# Patient Record
Sex: Male | Born: 1960 | Race: Black or African American | Hispanic: No | Marital: Married | Smoking: Never smoker
Health system: Southern US, Community
[De-identification: ages and names within clinical notes are randomized; demographics above are authoritative.]

## PROBLEM LIST (undated history)

## (undated) DIAGNOSIS — J449 Chronic obstructive pulmonary disease, unspecified: Secondary | ICD-10-CM

## (undated) DIAGNOSIS — J302 Other seasonal allergic rhinitis: Secondary | ICD-10-CM

## (undated) DIAGNOSIS — U071 COVID-19: Secondary | ICD-10-CM

## (undated) HISTORY — PX: EYE SURGERY: SHX253

---

## 2013-09-24 ENCOUNTER — Encounter (HOSPITAL_COMMUNITY): Payer: Self-pay | Admitting: Emergency Medicine

## 2013-09-24 ENCOUNTER — Ambulatory Visit (HOSPITAL_COMMUNITY)
Admission: RE | Admit: 2013-09-24 | Discharge: 2013-09-24 | Disposition: A | Discharge status: Home / Self Care | Payer: Self-pay | Source: Ambulatory Visit | Attending: Physician Assistant | Admitting: Physician Assistant

## 2013-09-24 ENCOUNTER — Inpatient Hospital Stay (HOSPITAL_COMMUNITY): Admission: RE | Admit: 2013-09-24 | Payer: Self-pay | Source: Ambulatory Visit

## 2013-09-24 ENCOUNTER — Ambulatory Visit (HOSPITAL_COMMUNITY): Payer: Self-pay

## 2013-09-24 ENCOUNTER — Emergency Department (HOSPITAL_COMMUNITY)
Admission: EM | Admit: 2013-09-24 | Discharge: 2013-09-24 | Disposition: A | Discharge status: Home / Self Care | Payer: Self-pay | Attending: Emergency Medicine | Admitting: Emergency Medicine

## 2013-09-24 ENCOUNTER — Other Ambulatory Visit (HOSPITAL_COMMUNITY): Payer: Self-pay | Admitting: Physician Assistant

## 2013-09-24 ENCOUNTER — Other Ambulatory Visit (HOSPITAL_COMMUNITY): Payer: Self-pay

## 2013-09-24 DIAGNOSIS — G44309 Post-traumatic headache, unspecified, not intractable: Secondary | ICD-10-CM

## 2013-09-24 DIAGNOSIS — Y9389 Activity, other specified: Secondary | ICD-10-CM | POA: Insufficient documentation

## 2013-09-24 DIAGNOSIS — M545 Low back pain, unspecified: Secondary | ICD-10-CM

## 2013-09-24 DIAGNOSIS — M47812 Spondylosis without myelopathy or radiculopathy, cervical region: Secondary | ICD-10-CM | POA: Insufficient documentation

## 2013-09-24 DIAGNOSIS — R209 Unspecified disturbances of skin sensation: Secondary | ICD-10-CM | POA: Insufficient documentation

## 2013-09-24 DIAGNOSIS — Y92410 Unspecified street and highway as the place of occurrence of the external cause: Secondary | ICD-10-CM | POA: Insufficient documentation

## 2013-09-24 DIAGNOSIS — S065X9A Traumatic subdural hemorrhage with loss of consciousness of unspecified duration, initial encounter: Secondary | ICD-10-CM

## 2013-09-24 DIAGNOSIS — R51 Headache: Secondary | ICD-10-CM | POA: Insufficient documentation

## 2013-09-24 DIAGNOSIS — R5381 Other malaise: Secondary | ICD-10-CM | POA: Insufficient documentation

## 2013-09-24 DIAGNOSIS — S065X0A Traumatic subdural hemorrhage without loss of consciousness, initial encounter: Secondary | ICD-10-CM | POA: Insufficient documentation

## 2013-09-24 DIAGNOSIS — M542 Cervicalgia: Secondary | ICD-10-CM | POA: Insufficient documentation

## 2013-09-24 MED ORDER — LEVETIRACETAM 500 MG PO TABS: 500.0000 mg | ORAL_TABLET | Freq: Two times a day (BID) | ORAL | Status: DC

## 2013-09-24 NOTE — ED Notes (Signed)
Neurologist at the bedside 
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
## 2013-09-25 ENCOUNTER — Ambulatory Visit (HOSPITAL_COMMUNITY): Admission: RE | Admit: 2013-09-25 | Payer: Self-pay | Source: Ambulatory Visit

## 2013-09-25 ENCOUNTER — Ambulatory Visit (HOSPITAL_COMMUNITY): Payer: Self-pay

## 2017-08-24 ENCOUNTER — Emergency Department (HOSPITAL_COMMUNITY): Payer: BLUE CROSS/BLUE SHIELD

## 2017-08-24 ENCOUNTER — Other Ambulatory Visit: Payer: Self-pay

## 2017-08-24 ENCOUNTER — Emergency Department (HOSPITAL_COMMUNITY)
Admission: EM | Admit: 2017-08-24 | Discharge: 2017-08-24 | Disposition: A | Discharge status: Home / Self Care | Payer: BLUE CROSS/BLUE SHIELD | Attending: Emergency Medicine | Admitting: Emergency Medicine

## 2017-08-24 ENCOUNTER — Encounter (HOSPITAL_COMMUNITY): Payer: Self-pay | Admitting: *Deleted

## 2017-08-24 DIAGNOSIS — R202 Paresthesia of skin: Secondary | ICD-10-CM | POA: Diagnosis not present

## 2017-08-24 LAB — COMPREHENSIVE METABOLIC PANEL
ALT: 14 U/L — ABNORMAL LOW (ref 17–63)
AST: 14 U/L — ABNORMAL LOW (ref 15–41)
Albumin: 4.5 g/dL (ref 3.5–5.0)
Alkaline Phosphatase: 74 U/L (ref 38–126)
Anion gap: 6 (ref 5–15)
BUN: 13 mg/dL (ref 6–20)
CO2: 29 mmol/L (ref 22–32)
Calcium: 9.7 mg/dL (ref 8.9–10.3)
Chloride: 103 mmol/L (ref 101–111)
Creatinine, Ser: 1.18 mg/dL (ref 0.61–1.24)
GFR calc Af Amer: >60 mL/min (ref >60)
GFR calc non Af Amer: >60 mL/min (ref >60)
Glucose, Bld: 105 mg/dL — ABNORMAL HIGH (ref 65–99)
Potassium: 3.9 mmol/L (ref 3.5–5.1)
Sodium: 138 mmol/L (ref 135–145)
Total Bilirubin: 0.6 mg/dL (ref 0.3–1.2)
Total Protein: 8.5 g/dL — ABNORMAL HIGH (ref 6.5–8.1)

## 2017-08-24 LAB — URINALYSIS, ROUTINE W REFLEX MICROSCOPIC
Bacteria, UA: NONE SEEN
Bilirubin Urine: NEGATIVE
Glucose, UA: NEGATIVE mg/dL
Ketones, ur: NEGATIVE mg/dL
Leukocytes, UA: NEGATIVE
Nitrite: NEGATIVE
Protein, ur: NEGATIVE mg/dL
Specific Gravity, Urine: 1.012 (ref 1.005–1.030)
Squamous Epithelial / LPF: NONE SEEN
pH: 7 (ref 5.0–8.0)

## 2017-08-24 LAB — DIFFERENTIAL
Basophils Absolute: 0 10*3/uL (ref 0.0–0.1)
Basophils Relative: 0 %
Eosinophils Absolute: 0.1 10*3/uL (ref 0.0–0.7)
Eosinophils Relative: 2 %
Lymphocytes Relative: 48 %
Lymphs Abs: 2.5 10*3/uL (ref 0.7–4.0)
Monocytes Absolute: 0.4 10*3/uL (ref 0.1–1.0)
Monocytes Relative: 8 %
Neutro Abs: 2.2 10*3/uL (ref 1.7–7.7)
Neutrophils Relative %: 42 %

## 2017-08-24 LAB — CBC
HCT: 43 % (ref 39.0–52.0)
Hemoglobin: 13.4 g/dL (ref 13.0–17.0)
MCH: 22.9 pg — ABNORMAL LOW (ref 26.0–34.0)
MCHC: 31.2 g/dL (ref 30.0–36.0)
MCV: 73.6 fL — ABNORMAL LOW (ref 78.0–100.0)
Platelets: 245 10*3/uL (ref 150–400)
RBC: 5.84 MIL/uL — ABNORMAL HIGH (ref 4.22–5.81)
RDW: 15.4 % (ref 11.5–15.5)
WBC: 5.2 10*3/uL (ref 4.0–10.5)

## 2017-08-24 LAB — ETHANOL: Alcohol, Ethyl (B): <10 mg/dL (ref <10)

## 2017-08-24 LAB — RAPID URINE DRUG SCREEN, HOSP PERFORMED
Amphetamines: NOT DETECTED
Barbiturates: NOT DETECTED
Benzodiazepines: NOT DETECTED
Cocaine: NOT DETECTED
Opiates: NOT DETECTED
Tetrahydrocannabinol: NOT DETECTED

## 2017-08-24 LAB — I-STAT TROPONIN, ED: Troponin i, poc: 0.01 ng/mL (ref 0.00–0.08)

## 2017-08-24 LAB — APTT: aPTT: 28 seconds (ref 24–36)

## 2017-08-24 LAB — PROTIME-INR
INR: 1.05
Prothrombin Time: 13.6 seconds (ref 11.4–15.2)

## 2017-08-24 NOTE — ED Notes (Signed)
Returned from CT.

## 2017-08-24 NOTE — ED Notes (Signed)
Pt states he does not need to urinate at this time, aware of DO  

## 2017-08-24 NOTE — Discharge Instructions (Signed)
Follow-up with your primary care doctor, return to the emergency room for worsening or recurrent symptoms °

## 2017-08-24 NOTE — ED Triage Notes (Addendum)
Pt c/o numbness to right hand and right leg that started 3-4 hours ago with a headache that lasted 5-6 minutes, upon arrival to er pt states that the numbness is gone, Dr Tomi Bamberger at bedside,

## 2017-08-24 NOTE — ED Provider Notes (Signed)
[facility] EMERGENCY DEPARTMENT Provider Note   CSN: [id number] Arrival date & time: 08/24/17  1836     History   Chief Complaint Chief Complaint  Patient presents with  . Numbness    HPI Dennis Lane is a 56 y.o. male.  HPI Patient presents to the emergency room for evaluation of numbness and tingling in his right hand as well as right leg.  His symptoms started about 2 or 3 hours ago and lasted for maybe 5 or 6 minutes.  The symptoms all resolved.  Patient does have a little bit of a mild headache but otherwise no other complaints.  He denies any trouble with his speech.  No trouble with his balance or gait.  No weakness.  No blurred vision.  History reviewed. No pertinent past medical history.  There are no active problems to display for this patient.   Past Surgical History:  Procedure Laterality Date  . EYE SURGERY         Home Medications    Prior to Admission medications   Medication Sig Start Date End Date Taking? Authorizing Provider  fluticasone (FLONASE) 50 MCG/ACT nasal spray Place 2 sprays into both nostrils daily.  07/18/17  Yes [provider]  PROAIR HFA 108 (90 Base) MCG/ACT inhaler Inhale 1-2 puffs into the lungs every 6 (six) hours as needed for wheezing or shortness of breath.  07/26/17  Yes [provider]    Family History No family history on file.  Social History Social History   Tobacco Use  . Smoking status: Never Smoker  . Smokeless tobacco: Never Used  Substance Use Topics  . Alcohol use: No  . Drug use: No     Allergies   Patient has no known allergies.   Review of Systems Review of Systems  All other systems reviewed and are negative.    Physical Exam Updated Vital Signs BP (!) 135/91   Pulse (!) 52   Temp 99.2 F (37.3 C)   Resp 19   Ht 1.727 m (5\' 8" )   Wt 75.3 kg (166 lb)   SpO2 99%   BMI 25.24 kg/m   Physical Exam  Constitutional: He is oriented to person, place, and time. He  appears well-developed and well-nourished. No distress.  HENT:  Head: Normocephalic and atraumatic.  Right Ear: External ear normal.  Left Ear: External ear normal.  Mouth/Throat: Oropharynx is clear and moist.  Eyes: Conjunctivae are normal. Right eye exhibits no discharge. Left eye exhibits no discharge. No scleral icterus.  Neck: Neck supple. No tracheal deviation present.  Cardiovascular: Normal rate, regular rhythm and intact distal pulses.  Pulmonary/Chest: Effort normal and breath sounds normal. No stridor. No respiratory distress. He has no wheezes. He has no rales.  Abdominal: Soft. Bowel sounds are normal. He exhibits no distension. There is no tenderness. There is no rebound and no guarding.  Musculoskeletal: He exhibits no edema or tenderness.  Neurological: He is alert and oriented to person, place, and time. He has normal strength. No cranial nerve deficit (No facial droop, extraocular movements intact, tongue midline ) or sensory deficit. He exhibits normal muscle tone. He displays no seizure activity. Coordination normal.  No pronator drift bilateral upper extrem, able to hold both legs off bed for 5 seconds, sensation intact in all extremities, no visual field cuts, no left or right sided neglect, normal finger-nose exam bilaterally, no nystagmus noted   Skin: Skin is warm and dry. No rash noted.  Psychiatric: He  has a normal mood and affect.  Nursing note and vitals reviewed.    ED Treatments / Results  Labs (all labs ordered are listed, but only abnormal results are displayed) Labs Reviewed  CBC - Abnormal; Notable for the following components:      Result Value   RBC 5.84 (*)    MCV 73.6 (*)    MCH 22.9 (*)    All other components within normal limits  COMPREHENSIVE METABOLIC PANEL - Abnormal; Notable for the following components:   Glucose, Bld 105 (*)    Total Protein 8.5 (*)    AST 14 (*)    ALT 14 (*)    All other components within normal limits    URINALYSIS, ROUTINE W REFLEX MICROSCOPIC - Abnormal; Notable for the following components:   Color, Urine STRAW (*)    Hgb urine dipstick SMALL (*)    All other components within normal limits  ETHANOL  PROTIME-INR  APTT  DIFFERENTIAL  RAPID URINE DRUG SCREEN, HOSP PERFORMED  I-STAT TROPONIN, ED    EKG  EKG Interpretation  Date/Time:  Thursday August 24 2017 20:55:43 EST Ventricular Rate:  58 PR Interval:    QRS Duration: 89 QT Interval:  416 QTC Calculation: 409 R Axis:   76 Text Interpretation:  Sinus rhythm No old tracing to compare Confirmed by Dorie Rank [phone]) on 08/24/2017 8:57:57 PM       Radiology Ct Head Wo Contrast  Result Date: 08/24/2017 CLINICAL DATA:  Numbness and tingling in the right hand and leg that started 3-4 hours ago. Numbness has resolved. EXAM: CT HEAD WITHOUT CONTRAST TECHNIQUE: Contiguous axial images were obtained from the base of the skull through the vertex without intravenous contrast. COMPARISON:  09/24/2013 FINDINGS: Brain: No evidence of infarction, hemorrhage, hydrocephalus, extra-axial collection or mass lesion/mass effect. Vascular: No hyperdense vessel or unexpected calcification. Skull: Normal. Negative for fracture or focal lesion. Sinuses/Orbits: No acute finding. IMPRESSION: Negative head CT. Electronically Signed   By: Monte Fantasia M.D.   On: 08/24/2017 19:09    Procedures Procedures (including critical care time)  Medications Ordered in ED Medications - No data to display   Initial Impression / Assessment and Plan / ED Course  I have reviewed the triage vital signs and the nursing notes.  Pertinent labs & imaging results that were available during my care of the patient were reviewed by me and considered in my medical decision making (see chart for details).   Patient presented to the emergency room for evaluation of a few minutes of numbness.  Patient's neurologic exam is normal.  Overall he is low risk for TIA/stroke.   Possible symptoms may have been an atypical migraine as he did have a headache.  At this time there does not appear to be any evidence of an acute emergency medical condition and the patient appears stable for discharge with appropriate outpatient follow up.   Final Clinical Impressions(s) / ED Diagnoses   Final diagnoses:  Paresthesia    ED Discharge Orders    None       Dorie Rank, MD 08/24/17 2059

## 2020-09-09 ENCOUNTER — Telehealth: Payer: BC Managed Care – PPO

## 2021-02-23 ENCOUNTER — Emergency Department (HOSPITAL_COMMUNITY): Payer: BC Managed Care – PPO

## 2021-02-23 ENCOUNTER — Emergency Department (HOSPITAL_COMMUNITY)
Admission: EM | Admit: 2021-02-23 | Discharge: 2021-02-24 | Disposition: A | Discharge status: Home / Self Care | Payer: BC Managed Care – PPO | Attending: Emergency Medicine | Admitting: Emergency Medicine

## 2021-02-23 ENCOUNTER — Other Ambulatory Visit: Payer: Self-pay

## 2021-02-23 ENCOUNTER — Encounter (HOSPITAL_COMMUNITY): Payer: Self-pay | Admitting: *Deleted

## 2021-02-23 DIAGNOSIS — R200 Anesthesia of skin: Secondary | ICD-10-CM | POA: Diagnosis present

## 2021-02-23 DIAGNOSIS — D1722 Benign lipomatous neoplasm of skin and subcutaneous tissue of left arm: Secondary | ICD-10-CM | POA: Diagnosis not present

## 2021-02-23 DIAGNOSIS — J069 Acute upper respiratory infection, unspecified: Secondary | ICD-10-CM | POA: Diagnosis not present

## 2021-02-23 DIAGNOSIS — R202 Paresthesia of skin: Secondary | ICD-10-CM | POA: Insufficient documentation

## 2021-02-23 LAB — CBC WITH DIFFERENTIAL/PLATELET
Abs Immature Granulocytes: 0.01 10*3/uL (ref 0.00–0.07)
Basophils Absolute: 0 10*3/uL (ref 0.0–0.1)
Basophils Relative: 1 %
Eosinophils Absolute: 0.2 10*3/uL (ref 0.0–0.5)
Eosinophils Relative: 2 %
HCT: 43.1 % (ref 39.0–52.0)
Hemoglobin: 13.2 g/dL (ref 13.0–17.0)
Immature Granulocytes: 0 %
Lymphocytes Relative: 36 %
Lymphs Abs: 2.3 10*3/uL (ref 0.7–4.0)
MCH: 22.8 pg — ABNORMAL LOW (ref 26.0–34.0)
MCHC: 30.6 g/dL (ref 30.0–36.0)
MCV: 74.6 fL — ABNORMAL LOW (ref 80.0–100.0)
Monocytes Absolute: 0.7 10*3/uL (ref 0.1–1.0)
Monocytes Relative: 11 %
Neutro Abs: 3.3 10*3/uL (ref 1.7–7.7)
Neutrophils Relative %: 50 %
Platelets: 280 10*3/uL (ref 150–400)
RBC: 5.78 MIL/uL (ref 4.22–5.81)
RDW: 15.5 % (ref 11.5–15.5)
WBC: 6.5 10*3/uL (ref 4.0–10.5)
nRBC: 0 % (ref 0.0–0.2)

## 2021-02-23 LAB — PROTIME-INR
INR: 1 (ref 0.8–1.2)
Prothrombin Time: 13.1 seconds (ref 11.4–15.2)

## 2021-02-23 LAB — COMPREHENSIVE METABOLIC PANEL
ALT: 15 U/L (ref 0–44)
AST: 22 U/L (ref 15–41)
Albumin: 4.6 g/dL (ref 3.5–5.0)
Alkaline Phosphatase: 64 U/L (ref 38–126)
Anion gap: 5 (ref 5–15)
BUN: 8 mg/dL (ref 8–23)
CO2: 29 mmol/L (ref 22–32)
Calcium: 9.2 mg/dL (ref 8.9–10.3)
Chloride: 103 mmol/L (ref 98–111)
Creatinine, Ser: 1.05 mg/dL (ref 0.61–1.24)
GFR, Estimated: >60 mL/min (ref >60)
Glucose, Bld: 98 mg/dL (ref 70–99)
Potassium: 4.2 mmol/L (ref 3.5–5.1)
Sodium: 137 mmol/L (ref 135–145)
Total Bilirubin: 0.5 mg/dL (ref 0.3–1.2)
Total Protein: 8.6 g/dL — ABNORMAL HIGH (ref 6.5–8.1)

## 2021-02-23 NOTE — ED Provider Notes (Signed)
[facility] EMERGENCY DEPARTMENT Provider Note   CSN: [id number] Arrival date & time: 02/23/21  2201     History Chief Complaint  Patient presents with  . Numbness    Dennis Lane is a 60 y.o. male.  60 year old male presents with complaint of Left-Sided Body Numbness difficulty with his speech, feeling off balance at times.  Initially, patient reports that this started over the past 3 days which prompted his work-up today to include CT head however on further questioning, states that his feet feel numb off and on for the past few months, the changes in his speech that he is reporting are noticed by those around him as mumbling and he relates this to having lost several of his teeth and has been an ongoing problem for several months.  He feels like the numbness in his feet is what causes him to feel off balance at times.  He has had sinus congestion for the past week and feels like he may have a sinus infection.  He denies fevers, chills, shortness of breath.  Patient has been told in the past that he has high cholesterol but he does not take medications for this as he does not go to a primary care doctor regularly.  He quit smoking 28 years ago.  No other complaints or concerns today.        History reviewed. No pertinent past medical history.  There are no problems to display for this patient.   Past Surgical History:  Procedure Laterality Date  . EYE SURGERY         History reviewed. No pertinent family history.  Social History   Tobacco Use  . Smoking status: Never Smoker  . Smokeless tobacco: Never Used  Substance Use Topics  . Alcohol use: No  . Drug use: No    Home Medications Prior to Admission medications   Medication Sig Start Date End Date Taking? Authorizing Provider  fluticasone (FLONASE) 50 MCG/ACT nasal spray Place 2 sprays into both nostrils daily.  07/18/17   [provider]  PROAIR HFA 108 (90 Base) MCG/ACT inhaler Inhale 1-2 puffs into  the lungs every 6 (six) hours as needed for wheezing or shortness of breath.  07/26/17   [provider]    Allergies    Patient has no known allergies.  Review of Systems   Review of Systems  Constitutional: Negative for chills, diaphoresis and fever.  HENT: Positive for congestion and sinus pressure.   Eyes: Negative for visual disturbance.  Respiratory: Positive for cough. Negative for shortness of breath.   Cardiovascular: Negative for chest pain.  Gastrointestinal: Negative for abdominal pain, constipation, diarrhea, nausea and vomiting.  Genitourinary: Negative for difficulty urinating.  Musculoskeletal: Negative for arthralgias and myalgias.  Skin: Negative for rash and wound.  Allergic/Immunologic: Negative for immunocompromised state.  Neurological: Positive for speech difficulty, numbness and headaches. Negative for dizziness, facial asymmetry and weakness.  Psychiatric/Behavioral: Negative for confusion.  All other systems reviewed and are negative.   Physical Exam Updated Vital Signs BP (!) 140/100   Pulse (!) 56   Temp 98.5 F (36.9 C) (Oral)   Resp (!) 21   Ht 5\' 8"  (1.727 m)   Wt 72.6 kg   SpO2 99%   BMI 24.33 kg/m   Physical Exam Vitals and nursing note reviewed.  Constitutional:      General: He is not in acute distress.    Appearance: He is well-developed. He is not diaphoretic.  HENT:  Head: Normocephalic and atraumatic.     Nose: Nose normal.     Mouth/Throat:     Mouth: Mucous membranes are moist.  Eyes:     Conjunctiva/sclera: Conjunctivae normal.  Cardiovascular:     Rate and Rhythm: Normal rate and regular rhythm.     Pulses: Normal pulses.     Heart sounds: Normal heart sounds.  Pulmonary:     Effort: Pulmonary effort is normal.     Breath sounds: Normal breath sounds.  Abdominal:     Palpations: Abdomen is soft.     Tenderness: There is no abdominal tenderness.  Musculoskeletal:     Cervical back: Neck supple.     Right  lower leg: No edema.     Left lower leg: No edema.  Skin:    General: Skin is warm and dry.     Findings: No erythema or rash.  Neurological:     General: No focal deficit present.     Mental Status: He is alert and oriented to person, place, and time.     Cranial Nerves: No cranial nerve deficit.     Sensory: No sensory deficit.     Motor: No weakness.     Coordination: Coordination normal.     Deep Tendon Reflexes: Reflexes normal.  Psychiatric:        Behavior: Behavior normal.     ED Results / Procedures / Treatments   Labs (all labs ordered are listed, but only abnormal results are displayed) Labs Reviewed  CBC WITH DIFFERENTIAL/PLATELET - Abnormal; Notable for the following components:      Result Value   MCV 74.6 (*)    MCH 22.8 (*)    All other components within normal limits  COMPREHENSIVE METABOLIC PANEL - Abnormal; Notable for the following components:   Total Protein 8.6 (*)    All other components within normal limits  RESP PANEL BY RT-PCR (FLU A&B, COVID) ARPGX2  PROTIME-INR  RAPID URINE DRUG SCREEN, HOSP PERFORMED    EKG EKG Interpretation  Date/Time:  Tuesday Feb 23 2021 22:18:39 EDT Ventricular Rate:  64 PR Interval:  178 QRS Duration: 84 QT Interval:  414 QTC Calculation: 427 R Axis:   63 Text Interpretation: Normal sinus rhythm Normal ECG Confirmed by Noemi Chapel [phone]) on 02/23/2021 10:32:07 PM   Radiology CT Head Wo Contrast  Result Date: 02/23/2021 CLINICAL DATA:  Neuro deficit. Stroke suspected. Pt with left sided numbness and slight weakness x 2 days, denies any HA, EXAM: CT HEAD WITHOUT CONTRAST TECHNIQUE: Contiguous axial images were obtained from the base of the skull through the vertex without intravenous contrast. COMPARISON:  CT head 08/24/2017 FINDINGS: Brain: No evidence of large-territorial acute infarction. No parenchymal hemorrhage. No mass lesion. No extra-axial collection. No mass effect or midline shift. No hydrocephalus.  Basilar cisterns are patent. Vascular: No hyperdense vessel. Skull: No acute fracture or focal lesion. Sinuses/Orbits: Paranasal sinuses and mastoid air cells are clear. The orbits are unremarkable. Other: None. IMPRESSION: No acute intracranial abnormality. Electronically Signed   By: Iven Finn M.D.   On: 02/23/2021 23:21   DG Chest Port 1 View  Result Date: 02/23/2021 CLINICAL DATA:  Weakness. EXAM: PORTABLE CHEST 1 VIEW COMPARISON:  None. FINDINGS: The cardiomediastinal contours are normal. The lungs are clear. Pulmonary vasculature is normal. No consolidation, pleural effusion, or pneumothorax. No acute osseous abnormalities are seen. IMPRESSION: No acute chest findings. Electronically Signed   By: Keith Rake M.D.   On: 02/23/2021 23:15  Procedures Procedures   Medications Ordered in ED Medications - No data to display  ED Course  I have reviewed the triage vital signs and the nursing notes.  Pertinent labs & imaging results that were available during my care of the patient were reviewed by me and considered in my medical decision making (see chart for details).  Clinical Course as of 02/23/21 2339  Tue May 24, 444  [id number] 60 year old male with inconsistent history as above, his complaints on further questioning seem to be more chronic in nature with a more acute complaint of sinus congestion for a week.  Initial neuro exam had some altered sensation in V2 only however on repeat exam this is normal.  No focal findings otherwise.  CT head unremarkable, chest x-ray unremarkable.  Labs reassuring including CBC, CMP.  COVID pending. [LM]  2302 Patient's wife is at bedside at this time, reviewed his multiple complaints with her, she states that the reason he came in today was because of a painful lump on his left forearm.  Appears to have a lipoma, can give information for general surgery for further evaluation outpatient.  Blood pressure is improved to currently 130/90, patient is  ready for discharge at this time. [LM]    Clinical Course User Index [LM] Roque Lias   MDM Rules/Calculators/A&P                          Final Clinical Impression(s) / ED Diagnoses Final diagnoses:  Viral URI with cough  Paresthesia of foot, bilateral  Lipoma of left upper extremity    Rx / DC Orders ED Discharge Orders    None       Roque Lias 02/23/21 2339    Noemi Chapel, MD 02/24/21 [phone]

## 2021-02-23 NOTE — Discharge Instructions (Signed)
Recheck with your primary care provider. Referral for general surgery if you would like to discuss the lump on your left arm.

## 2021-02-23 NOTE — ED Notes (Signed)
Dizziness this morning after waking up.

## 2021-02-23 NOTE — ED Notes (Signed)
Patient transported to X-ray & CT °

## 2021-02-23 NOTE — ED Triage Notes (Signed)
Pt with left sided numbness and slight weakness x 2 days, denies any HA, no arm or leg drift noted in triage.

## 2021-03-18 ENCOUNTER — Ambulatory Visit: Payer: BC Managed Care – PPO | Admitting: General Surgery

## 2021-09-16 ENCOUNTER — Telehealth: Payer: Self-pay

## 2021-09-16 NOTE — Telephone Encounter (Signed)
CALLED PATIENT SOMEONE ANSWERED SAID HE WASN'T HOME ASKED THEM TO PLEASE HAVE Dennis Lane CALL us WHEN HE CAN

## 2022-01-26 ENCOUNTER — Encounter: Payer: Self-pay | Admitting: Internal Medicine

## 2022-01-31 ENCOUNTER — Other Ambulatory Visit: Payer: Self-pay | Admitting: Internal Medicine

## 2022-01-31 ENCOUNTER — Other Ambulatory Visit (HOSPITAL_COMMUNITY): Payer: Self-pay | Admitting: Internal Medicine

## 2022-01-31 DIAGNOSIS — R42 Dizziness and giddiness: Secondary | ICD-10-CM

## 2022-02-01 ENCOUNTER — Ambulatory Visit (HOSPITAL_COMMUNITY)
Admission: RE | Admit: 2022-02-01 | Discharge: 2022-02-01 | Disposition: A | Discharge status: Home / Self Care | Payer: BC Managed Care – PPO | Source: Ambulatory Visit | Attending: Internal Medicine | Admitting: Internal Medicine

## 2022-02-01 DIAGNOSIS — R42 Dizziness and giddiness: Secondary | ICD-10-CM | POA: Insufficient documentation

## 2022-02-19 ENCOUNTER — Other Ambulatory Visit: Payer: Self-pay

## 2022-02-19 ENCOUNTER — Emergency Department (HOSPITAL_COMMUNITY): Payer: BC Managed Care – PPO

## 2022-02-19 ENCOUNTER — Inpatient Hospital Stay (HOSPITAL_COMMUNITY)
Admission: EM | Admit: 2022-02-19 | Discharge: 2022-02-21 | DRG: 178 | Disposition: A | Discharge status: Home / Self Care | Payer: BC Managed Care – PPO | Attending: Internal Medicine | Admitting: Internal Medicine

## 2022-02-19 ENCOUNTER — Encounter (HOSPITAL_COMMUNITY): Payer: Self-pay | Admitting: Emergency Medicine

## 2022-02-19 DIAGNOSIS — H53123 Transient visual loss, bilateral: Secondary | ICD-10-CM | POA: Diagnosis present

## 2022-02-19 DIAGNOSIS — I951 Orthostatic hypotension: Secondary | ICD-10-CM | POA: Diagnosis not present

## 2022-02-19 DIAGNOSIS — N179 Acute kidney failure, unspecified: Secondary | ICD-10-CM | POA: Diagnosis present

## 2022-02-19 DIAGNOSIS — U071 COVID-19: Secondary | ICD-10-CM | POA: Diagnosis not present

## 2022-02-19 DIAGNOSIS — H538 Other visual disturbances: Secondary | ICD-10-CM | POA: Diagnosis present

## 2022-02-19 DIAGNOSIS — E860 Dehydration: Secondary | ICD-10-CM | POA: Diagnosis present

## 2022-02-19 DIAGNOSIS — A419 Sepsis, unspecified organism: Secondary | ICD-10-CM

## 2022-02-19 DIAGNOSIS — J302 Other seasonal allergic rhinitis: Secondary | ICD-10-CM | POA: Diagnosis present

## 2022-02-19 DIAGNOSIS — H539 Unspecified visual disturbance: Secondary | ICD-10-CM

## 2022-02-19 DIAGNOSIS — Z87891 Personal history of nicotine dependence: Secondary | ICD-10-CM

## 2022-02-19 HISTORY — DX: Other seasonal allergic rhinitis: J30.2

## 2022-02-19 HISTORY — DX: Chronic obstructive pulmonary disease, unspecified: J44.9

## 2022-02-19 LAB — COMPREHENSIVE METABOLIC PANEL
ALT: 11 U/L (ref 0–44)
AST: 18 U/L (ref 15–41)
Albumin: 4.5 g/dL (ref 3.5–5.0)
Alkaline Phosphatase: 70 U/L (ref 38–126)
Anion gap: 9 (ref 5–15)
BUN: 9 mg/dL (ref 8–23)
CO2: 26 mmol/L (ref 22–32)
Calcium: 8.9 mg/dL (ref 8.9–10.3)
Chloride: 105 mmol/L (ref 98–111)
Creatinine, Ser: 1.25 mg/dL — ABNORMAL HIGH (ref 0.61–1.24)
GFR, Estimated: >60 mL/min (ref >60)
Glucose, Bld: 108 mg/dL — ABNORMAL HIGH (ref 70–99)
Potassium: 3.5 mmol/L (ref 3.5–5.1)
Sodium: 140 mmol/L (ref 135–145)
Total Bilirubin: 0.5 mg/dL (ref 0.3–1.2)
Total Protein: 8.2 g/dL — ABNORMAL HIGH (ref 6.5–8.1)

## 2022-02-19 LAB — DIFFERENTIAL
Abs Immature Granulocytes: 0.02 10*3/uL (ref 0.00–0.07)
Basophils Absolute: 0 10*3/uL (ref 0.0–0.1)
Basophils Relative: 0 %
Eosinophils Absolute: 0 10*3/uL (ref 0.0–0.5)
Eosinophils Relative: 0 %
Immature Granulocytes: 0 %
Lymphocytes Relative: 8 %
Lymphs Abs: 0.6 10*3/uL — ABNORMAL LOW (ref 0.7–4.0)
Monocytes Absolute: 0.7 10*3/uL (ref 0.1–1.0)
Monocytes Relative: 9 %
Neutro Abs: 6.5 10*3/uL (ref 1.7–7.7)
Neutrophils Relative %: 83 %

## 2022-02-19 LAB — RESP PANEL BY RT-PCR (FLU A&B, COVID) ARPGX2
Influenza A by PCR: NEGATIVE
Influenza B by PCR: NEGATIVE
SARS Coronavirus 2 by RT PCR: POSITIVE — AB

## 2022-02-19 LAB — URINALYSIS, ROUTINE W REFLEX MICROSCOPIC
Bacteria, UA: NONE SEEN
Bilirubin Urine: NEGATIVE
Glucose, UA: NEGATIVE mg/dL
Ketones, ur: NEGATIVE mg/dL
Leukocytes,Ua: NEGATIVE
Nitrite: NEGATIVE
Protein, ur: NEGATIVE mg/dL
Specific Gravity, Urine: 1.011 (ref 1.005–1.030)
pH: 7 (ref 5.0–8.0)

## 2022-02-19 LAB — PROTIME-INR
INR: 1 (ref 0.8–1.2)
Prothrombin Time: 13.2 seconds (ref 11.4–15.2)

## 2022-02-19 LAB — APTT: aPTT: 28 seconds (ref 24–36)

## 2022-02-19 LAB — CBC
HCT: 43.9 % (ref 39.0–52.0)
Hemoglobin: 13.6 g/dL (ref 13.0–17.0)
MCH: 23.2 pg — ABNORMAL LOW (ref 26.0–34.0)
MCHC: 31 g/dL (ref 30.0–36.0)
MCV: 74.8 fL — ABNORMAL LOW (ref 80.0–100.0)
Platelets: 246 10*3/uL (ref 150–400)
RBC: 5.87 MIL/uL — ABNORMAL HIGH (ref 4.22–5.81)
RDW: 16.3 % — ABNORMAL HIGH (ref 11.5–15.5)
WBC: 7.9 10*3/uL (ref 4.0–10.5)
nRBC: 0 % (ref 0.0–0.2)

## 2022-02-19 LAB — RAPID URINE DRUG SCREEN, HOSP PERFORMED
Amphetamines: NOT DETECTED
Barbiturates: NOT DETECTED
Benzodiazepines: NOT DETECTED
Cocaine: NOT DETECTED
Opiates: NOT DETECTED
Tetrahydrocannabinol: NOT DETECTED

## 2022-02-19 LAB — CBG MONITORING, ED: Glucose-Capillary: 93 mg/dL (ref 70–99)

## 2022-02-19 LAB — ETHANOL: Alcohol, Ethyl (B): <10 mg/dL

## 2022-02-19 LAB — LACTIC ACID, PLASMA
Lactic Acid, Venous: 2.1 mmol/L (ref 0.5–1.9)
Lactic Acid, Venous: 1.7 mmol/L (ref 0.5–1.9)

## 2022-02-19 MED ORDER — LACTATED RINGERS IV BOLUS
1000.0000 mL | Freq: Once | INTRAVENOUS | Status: AC
  Administered 2022-02-19: 1000 mL via INTRAVENOUS

## 2022-02-19 MED ORDER — METRONIDAZOLE 500 MG/100ML IV SOLN
500.0000 mg | Freq: Once | INTRAVENOUS | Status: AC
  Administered 2022-02-19: 500 mg via INTRAVENOUS
  Filled 2022-02-19: qty 100

## 2022-02-19 MED ORDER — VANCOMYCIN HCL 1500 MG/300ML IV SOLN: 1500.0000 mg | INTRAVENOUS | Status: DC

## 2022-02-19 MED ORDER — ALBUTEROL SULFATE HFA 108 (90 BASE) MCG/ACT IN AERS: 1.0000 | INHALATION_SPRAY | Freq: Four times a day (QID) | RESPIRATORY_TRACT | Status: DC | PRN

## 2022-02-19 MED ORDER — ONDANSETRON HCL 4 MG/2ML IJ SOLN: 4.0000 mg | Freq: Four times a day (QID) | INTRAMUSCULAR | Status: DC | PRN

## 2022-02-19 MED ORDER — ONDANSETRON HCL 4 MG PO TABS: 4.0000 mg | ORAL_TABLET | Freq: Four times a day (QID) | ORAL | Status: DC | PRN

## 2022-02-19 MED ORDER — ACETAMINOPHEN 325 MG PO TABS
650.0000 mg | ORAL_TABLET | Freq: Once | ORAL | Status: AC
  Administered 2022-02-19: 650 mg via ORAL
  Filled 2022-02-19: qty 2

## 2022-02-19 MED ORDER — VANCOMYCIN HCL IN DEXTROSE 1-5 GM/200ML-% IV SOLN
1000.0000 mg | Freq: Once | INTRAVENOUS | Status: AC
Start: 2022-02-19 — End: 2022-02-19
  Administered 2022-02-19: 1000 mg via INTRAVENOUS
  Filled 2022-02-19: qty 200

## 2022-02-19 MED ORDER — FLUTICASONE FUROATE-VILANTEROL 100-25 MCG/ACT IN AEPB
1.0000 | INHALATION_SPRAY | Freq: Every day | RESPIRATORY_TRACT | Status: DC
  Administered 2022-02-20 – 2022-02-21 (×2): 1 via RESPIRATORY_TRACT
  Filled 2022-02-19: qty 28

## 2022-02-19 MED ORDER — FLUTICASONE PROPIONATE 50 MCG/ACT NA SUSP
2.0000 | Freq: Every day | NASAL | Status: DC
  Administered 2022-02-20: 2 via NASAL
  Filled 2022-02-19: qty 16

## 2022-02-19 MED ORDER — SODIUM CHLORIDE 0.9 % IV SOLN: 2.0000 g | Freq: Three times a day (TID) | INTRAVENOUS | Status: DC

## 2022-02-19 MED ORDER — LACTATED RINGERS IV SOLN: INTRAVENOUS | Status: DC

## 2022-02-19 MED ORDER — CEFEPIME HCL 2 G IV SOLR
2.0000 g | Freq: Once | INTRAVENOUS | Status: AC
  Administered 2022-02-19: 2 g via INTRAVENOUS
  Filled 2022-02-19: qty 12.5

## 2022-02-19 MED ORDER — ENOXAPARIN SODIUM 40 MG/0.4ML IJ SOSY
40.0000 mg | PREFILLED_SYRINGE | INTRAMUSCULAR | Status: DC
  Administered 2022-02-20 (×2): 40 mg via SUBCUTANEOUS
  Filled 2022-02-19 (×2): qty 0.4

## 2022-02-19 MED ORDER — GUAIFENESIN ER 600 MG PO TB12
600.0000 mg | ORAL_TABLET | Freq: Two times a day (BID) | ORAL | Status: DC
  Administered 2022-02-20 – 2022-02-21 (×3): 600 mg via ORAL
  Filled 2022-02-19 (×3): qty 1

## 2022-02-19 NOTE — ED Provider Triage Note (Signed)
Emergency Medicine Provider Triage Evaluation Note  Dennis Lane , a 61 y.o. male  was evaluated in triage.    Acute bilateral vision loss 8:30 AM this morning lasted several seconds before gradually resolving, patient reports continued slightly blurry vision out of both eyes.  Reports this has happened a few times over the past few months but he has not been evaluated for this in the past.  Additionally patient reports associated dizziness over the past few days worsened with certain movements.  He denies any extremity weakness, numbness, fall, injury, difficulty speaking or headache.  Of note patient reports that over the last few months he is also noticed a cough, he was not aware of a fever prior to arrival, temperature in triage of 101.4 F.  Review of Systems  Positive: Vision change, dizziness, fever Negative: Fall, injury, difficulty speaking, numbness, weakness, tingling, headache, nausea, vomiting, diarrhea or any additional concerns.  Physical Exam  BP 96/69 (BP Location: Right Arm)   Pulse (!) 101   Temp (!) 101.4 F (38.6 C) (Oral)   Resp 20   Ht '5\' 8"'$  (1.727 m)   Wt 72.6 kg   SpO2 97%   BMI 24.33 kg/m  Gen:   Awake, no distress   Resp:  Normal effort  MSK:   Moves extremities without difficulty  Other:    Speech is clear and goal oriented, follows commands Major Cranial nerves without deficit, no facial droop Normal strength in upper and lower extremities bilaterally including dorsiflexion and plantar flexion, strong and equal grip strength Sensation normal to light and sharp touch Moves extremities without ataxia, coordination intact Normal finger to nose Neg romberg, no pronator drift   Medical Decision Making  Medically screening exam initiated at 1:48 PM.  Appropriate orders placed.  Dennis Lane was informed that the remainder of the evaluation will be completed by another provider, this initial triage assessment does not replace that evaluation, and the  importance of remaining in the ED until their evaluation is complete.   Note: Portions of this report may have been transcribed using voice recognition software. Every effort was made to ensure accuracy; however, inadvertent computerized transcription errors may still be present.    Deliah Boston, PA-C 02/19/22 1350

## 2022-02-19 NOTE — ED Provider Notes (Signed)
[facility] EMERGENCY DEPARTMENT Provider Note   CSN: [id number] Arrival date & time: 02/19/22  1202     History  Chief Complaint  Patient presents with   Loss of Vision    Dennis Lane is a 61 y.o. male reports a history of asthma otherwise healthy presented with his wife today for evaluation of sudden temporary vision loss  Patient reports that he was lying in bed just waking up around 8:30 AM when he opened his eyes he reports he cannot see, he reports everything was pitch black out of both eyes this lasted for less than 10 seconds before spontaneously resolving.  Patient reports he has noticed a little bit of blurry vision out of both eyes since that time.  He reports this happened to him 2 or 3 times over the past few months he is never sought evaluation for prior to this.  Additionally patient reports that over the past few days he has noticed some mild dizziness/lightheadedness which is worse with standing.  Patient does also endorse a chronic cough which he feels has worsened over the past few days.  He was unaware that he had a fever of 101.4 prior to arrival.  He denies any fall, injury, fever, chills, headache, difficulty speaking, neck stiffness, extremity numbness, weakness, tingling, abdominal pain, nausea, vomiting, chest pain, diarrhea or any additional concerns.  HPI     Home Medications Prior to Admission medications   Medication Sig Start Date End Date Taking? Authorizing Provider  ADVAIR HFA [phone] MCG/ACT inhaler Inhale 2 puffs into the lungs 2 (two) times daily. 09/10/21  Yes [provider]  Aspirin-Acetaminophen (GOODYS BODY PAIN PO) Take 1 packet by mouth daily as needed (pain,headache.).   Yes [provider]  fluticasone (FLONASE) 50 MCG/ACT nasal spray Place 2 sprays into both nostrils daily.  07/18/17  Yes [provider]  PROAIR HFA 108 (90 Base) MCG/ACT inhaler Inhale 1-2 puffs into the lungs every 6 (six) hours as needed for  wheezing or shortness of breath.  07/26/17  Yes [provider]      Allergies    Patient has no known allergies.    Review of Systems   Review of Systems Ten systems are reviewed and are negative for acute change except as noted in the HPI  Physical Exam Updated Vital Signs BP 124/76   Pulse (!) 56   Temp 99.5 F (37.5 C) (Oral)   Resp 13   Ht '5\' 8"'$  (1.727 m)   Wt 72.6 kg   SpO2 98%   BMI 24.33 kg/m  Physical Exam Constitutional:      General: He is not in acute distress.    Appearance: Normal appearance. He is well-developed. He is not ill-appearing or diaphoretic.  HENT:     Head: Normocephalic and atraumatic.  Eyes:     General: Vision grossly intact. Gaze aligned appropriately.     Pupils: Pupils are equal, round, and reactive to light.  Neck:     Trachea: Trachea and phonation normal.  Cardiovascular:     Rate and Rhythm: Regular rhythm. Tachycardia present.     Pulses: Normal pulses.  Pulmonary:     Effort: Pulmonary effort is normal. No respiratory distress.     Breath sounds: Normal breath sounds.  Abdominal:     General: There is no distension.     Palpations: Abdomen is soft.     Tenderness: There is no abdominal tenderness. There is no guarding or rebound.  Musculoskeletal:  General: Normal range of motion.     Cervical back: Normal range of motion.  Skin:    General: Skin is warm and dry.  Neurological:     Mental Status: He is alert.     GCS: GCS eye subscore is 4. GCS verbal subscore is 5. GCS motor subscore is 6.     Comments: Speech is clear and goal oriented, follows commands Major Cranial nerves without deficit, no facial droop Normal strength in upper and lower extremities bilaterally including dorsiflexion and plantar flexion, strong and equal grip strength Sensation normal to light and sharp touch Moves extremities without ataxia, coordination intact Normal finger to nose and rapid alternating movements Neg romberg, no  pronator drift Normal gait  Psychiatric:        Behavior: Behavior normal.    ED Results / Procedures / Treatments   Labs (all labs ordered are listed, but only abnormal results are displayed) Labs Reviewed  RESP PANEL BY RT-PCR (FLU A&B, COVID) ARPGX2 - Abnormal; Notable for the following components:      Result Value   SARS Coronavirus 2 by RT PCR POSITIVE (*)    All other components within normal limits  CBC - Abnormal; Notable for the following components:   RBC 5.87 (*)    MCV 74.8 (*)    MCH 23.2 (*)    RDW 16.3 (*)    All other components within normal limits  DIFFERENTIAL - Abnormal; Notable for the following components:   Lymphs Abs 0.6 (*)    All other components within normal limits  COMPREHENSIVE METABOLIC PANEL - Abnormal; Notable for the following components:   Glucose, Bld 108 (*)    Creatinine, Ser 1.25 (*)    Total Protein 8.2 (*)    All other components within normal limits  URINALYSIS, ROUTINE W REFLEX MICROSCOPIC - Abnormal; Notable for the following components:   Hgb urine dipstick SMALL (*)    All other components within normal limits  LACTIC ACID, PLASMA - Abnormal; Notable for the following components:   Lactic Acid, Venous 2.1 (*)    All other components within normal limits  CULTURE, BLOOD (ROUTINE X 2)  CULTURE, BLOOD (ROUTINE X 2)  ETHANOL  PROTIME-INR  APTT  RAPID URINE DRUG SCREEN, HOSP PERFORMED  LACTIC ACID, PLASMA  CBG MONITORING, ED    EKG None  Radiology DG Chest 2 View  Result Date: 02/19/2022 CLINICAL DATA:  Fever. EXAM: CHEST - 2 VIEW COMPARISON:  02/23/2021 FINDINGS: The heart size and mediastinal contours are within normal limits. Both lungs are clear. The visualized skeletal structures are unremarkable. IMPRESSION: No active cardiopulmonary disease. Electronically Signed   By: Marlaine Hind M.D.   On: 02/19/2022 14:49   CT HEAD WO CONTRAST  Result Date: 02/19/2022 CLINICAL DATA:  Transient vision loss EXAM: CT HEAD WITHOUT  CONTRAST TECHNIQUE: Contiguous axial images were obtained from the base of the skull through the vertex without intravenous contrast. RADIATION DOSE REDUCTION: This exam was performed according to the departmental dose-optimization program which includes automated exposure control, adjustment of the mA and/or kV according to patient size and/or use of iterative reconstruction technique. COMPARISON:  02/01/2022 FINDINGS: Brain: No acute intracranial findings are seen in noncontrast CT brain. Ventricles are not dilated. There is no shift of midline structures. There are no epidural or subdural fluid collections. There is no focal edema or mass effect. Vascular: Unremarkable. Skull: Unremarkable. Sinuses/Orbits: Visualized paranasal sinuses are unremarkable. Visualized portions of orbits are unremarkable. Other: No significant  interval changes are noted. IMPRESSION: No acute intracranial findings are seen in noncontrast CT brain. Electronically Signed   By: Elmer Picker M.D.   On: 02/19/2022 14:59    Procedures .Critical Care Performed by: Deliah Boston, PA-C Authorized by: Deliah Boston, PA-C   Critical care provider statement:    Critical care time (minutes):  32   Critical care was necessary to treat or prevent imminent or life-threatening deterioration of the following conditions:  Sepsis   Critical care was time spent personally by me on the following activities:  Development of treatment plan with patient or surrogate, discussions with consultants, evaluation of patient's response to treatment, examination of patient, ordering and review of laboratory studies, ordering and review of radiographic studies, ordering and performing treatments and interventions, pulse oximetry, re-evaluation of patient's condition and review of old charts    Medications Ordered in ED Medications  lactated ringers infusion ( Intravenous New Bag/Given 02/19/22 1708)  ceFEPIme (MAXIPIME) 2 g in sodium  chloride 0.9 % 100 mL IVPB (has no administration in time range)  vancomycin (VANCOREADY) IVPB 1500 mg/300 mL (has no administration in time range)  lactated ringers bolus 1,000 mL (has no administration in time range)  acetaminophen (TYLENOL) tablet 650 mg (650 mg Oral Given 02/19/22 1506)  lactated ringers bolus 1,000 mL (0 mLs Intravenous Stopped 02/19/22 1707)  ceFEPIme (MAXIPIME) 2 g in sodium chloride 0.9 % 100 mL IVPB (0 g Intravenous Stopped 02/19/22 1634)  metroNIDAZOLE (FLAGYL) IVPB 500 mg (0 mg Intravenous Stopped 02/19/22 1759)  vancomycin (VANCOCIN) IVPB 1000 mg/200 mL premix (0 mg Intravenous Stopped 02/19/22 1905)    ED Course/ Medical Decision Making/ A&P Clinical Course as of 02/19/22 1915  Sat Feb 19, 2022  1845 Dr. Nehemiah Settle [BM]  1910 Lactic acid, plasma Reassuring [BM]  1910 Resp Panel by RT-PCR (Flu A&B, Covid) Nasopharyngeal Swab(!) Suspect as cause of patient's fatigue, cough and lightheadedness. [BM]  1911 Comprehensive metabolic panel(!) Mild AKI with creatinine of 1.25 suspect due to dehydration.  No emergent electrolyte derangement, LFT elevations or gap. [BM]  1911 Ethanol Patient does not appear to be intoxicated or in withdrawal. [BM]  1911 CBC(!) No leukocytosis to suggest bacterial infection.  No anemia or thrombocytopenia. [BM]  1911 CBG monitoring, ED Low suspicion for hypoglycemia as cause of symptoms. [BM]  1911 Urinalysis, Routine w reflex microscopic Urine, Clean Catch(!)  No evidence for UTI. [BM]  1912 CT HEAD WO CONTRAST I personally reviewed patient's CT head, I do not appreciate any obvious acute hemorrhage or mass effect. [BM]  1912 DG Chest 2 View I personally reviewed patient's chest x-ray.  I not appreciate any obvious PTX, PNA or other acute abnormalities [BM]    Clinical Course User Index [BM] Gari Crown                           Medical Decision Making 61 year old male presented for transient bilateral vision loss that  occurred shortly after waking up this morning, symptoms resolved completely within a few seconds.  Additionally reporting lightheadedness with standing along with chronic cough and fatigue.  On evaluation he is in no acute distress, neurologic examination is unremarkable.  Patient is febrile in triage she was unaware that he had a fever prior to arrival.  He is also mildly tachycardic.  Case was discussed with attending physician Dr. Jeanell Sparrow, patient's vision loss does not appear consistent with CVA/TIA, suspect this may be secondary  to his fever and possible sepsis.  Sepsis order set was initiated along with broad-spectrum antibiotics for unknown source.  Labs were ordered along with chest x-ray and a CT scan of the head.   Amount and/or Complexity of Data Reviewed Independent Historian: spouse Labs: ordered. Decision-making details documented in ED Course. Radiology: ordered. Decision-making details documented in ED Course.  Risk OTC drugs. Prescription drug management. Decision regarding hospitalization.  ------------------------------------------ Patient received IV fluids as well as broad-spectrum antibiotics today for presumed sepsis of unknown source.  Patient's COVID test turned out to be positive which is likely contributing to the majority of his symptoms today.  After patient received IV fluids he was orthostatic, suspect this is likely due to dehydration and viral infection.  No clear cause identified at this time for patient's intermittent bilateral vision loss however does not seem like would be consistent with CVA or TIA.  On reevaluation patient is resting in bed, wife at bedside no acute distress denies any visual changes at this time.  Vital signs are stable on room air.  I feel patient needs further observation and treatment for the above problems, I discussed the case with attending physician Dr. Eulis Foster, plan to consult hospitalist for admission and further treatment. ------ I  consulted with hospitalist Dr. Nehemiah Settle, patient was accepted for admission   Dennis Lane was evaluated in Emergency Department on 02/19/2022 for the symptoms described in the history of present illness. He was evaluated in the context of the global COVID-19 pandemic, which necessitated consideration that the patient might be at risk for infection with the SARS-CoV-2 virus that causes COVID-19. Institutional protocols and algorithms that pertain to the evaluation of patients at risk for COVID-19 are in a state of rapid change based on information released by regulatory bodies including the CDC and federal and state organizations. These policies and algorithms were followed during the patient's care in the ED.   Note: Portions of this report may have been transcribed using voice recognition software. Every effort was made to ensure accuracy; however, inadvertent computerized transcription errors may still be present.         Final Clinical Impression(s) / ED Diagnoses Final diagnoses:  COVID-19  Orthostatic hypotension  Dehydration  Vision changes  Sepsis without acute organ dysfunction, due to unspecified organism [facility])    Rx / DC Orders ED Discharge Orders     None         Gari Crown 02/19/22 1916    Daleen Bo, MD 02/20/22 [phone]

## 2022-02-19 NOTE — Progress Notes (Signed)
Elink following for sepsis protocol. 

## 2022-02-19 NOTE — ED Notes (Signed)
Gave pt dinner tray. Informed nurse.

## 2022-02-19 NOTE — H&P (Signed)
History and Physical    Patient: Dennis Lane ZOX:[id number] DOB: 05-01-1961 DOA: 02/19/2022 DOS: the patient was seen and examined on 02/19/2022 PCP: Bronson Curb, PA-C  Patient coming from: Home  Chief Complaint:  Chief Complaint  Patient presents with   Loss of Vision   HPI: Dennis Lane is a 61 y.o. male with medical history significant of COPD.  Patient has been having increasing cough and shortness of breath over the past several weeks.  He presents today with a few brief self-limiting episodes of presyncope with vision turning gray and then completely black.  He never lost consciousness.  These lasted for a few seconds and then resolved.  No other neurological deficits: Weakness, paresthesias, slurred speech, facial droop, etc.  He has never had these episodes before.  In the emergency department he was noted to have an AKI on his blood work and was orthostatic.  He did test positive for COVID-pneumonia.  Review of Systems: As mentioned in the history of present illness. All other systems reviewed and are negative. Past Medical History:  Diagnosis Date   Seasonal allergies    Past Surgical History:  Procedure Laterality Date   EYE SURGERY     Social History:  reports that he has never smoked. He has never used smokeless tobacco. He reports that he does not drink alcohol and does not use drugs.  No Known Allergies  No family history on file.  Prior to Admission medications   Medication Sig Start Date End Date Taking? Authorizing Provider  ADVAIR HFA [phone] MCG/ACT inhaler Inhale 2 puffs into the lungs 2 (two) times daily. 09/10/21  Yes [provider]  Aspirin-Acetaminophen (GOODYS BODY PAIN PO) Take 1 packet by mouth daily as needed (pain,headache.).   Yes [provider]  fluticasone (FLONASE) 50 MCG/ACT nasal spray Place 2 sprays into both nostrils daily.  07/18/17  Yes [provider]  PROAIR HFA 108 (90 Base) MCG/ACT inhaler Inhale 1-2  puffs into the lungs every 6 (six) hours as needed for wheezing or shortness of breath.  07/26/17  Yes [provider]    Physical Exam: Vitals:   02/19/22 1730 02/19/22 1800 02/19/22 1830 02/19/22 1900  BP: 115/68 108/69 124/76 120/84  Pulse: (!) 59 (!) 54 (!) 56 80  Resp: (!) '23 19 13 16  '$ Temp:      TempSrc:      SpO2: 97% 98% 98% 98%  Weight:      Height:       General: Older male. Awake and alert and oriented x3. No acute cardiopulmonary distress.  HEENT: Normocephalic atraumatic.  Right and left ears normal in appearance.  Pupils equal, round, reactive to light. Extraocular muscles are intact. Sclerae anicteric and noninjected.  Moist mucosal membranes. No mucosal lesions.  Neck: Neck supple without lymphadenopathy. No carotid bruits. No masses palpated.  Cardiovascular: Regular rate with normal S1-S2 sounds. No murmurs, rubs, gallops auscultated. No JVD.  Respiratory: Good respiratory effort with no wheezes, rales, rhonchi. Lungs clear to auscultation bilaterally.  No accessory muscle use. Abdomen: Mild rales diffusely.  No wheezing.  No masses or hepatosplenomegaly  Skin: No rashes, lesions, or ulcerations.  Dry, warm to touch. 2+ dorsalis pedis and radial pulses. Musculoskeletal: No calf or leg pain. All major joints not erythematous nontender.  No upper or lower joint deformation.  Good ROM.  No contractures  Psychiatric: Intact judgment and insight. Pleasant and cooperative. Neurologic: No focal neurological deficits. Strength is 5/5 and symmetric in upper  and lower extremities.  Cranial nerves II through XII are grossly intact.  Data Reviewed: Results for orders placed or performed during the hospital encounter of 02/19/22 (from the past 24 hour(s))  CBG monitoring, ED     Status: None   Collection Time: 02/19/22 12:36 PM  Result Value Ref Range   Glucose-Capillary 93 70 - 99 mg/dL  Urine rapid drug screen (hosp performed)     Status: None   Collection Time:  02/19/22  1:47 PM  Result Value Ref Range   Opiates NONE DETECTED NONE DETECTED   Cocaine NONE DETECTED NONE DETECTED   Benzodiazepines NONE DETECTED NONE DETECTED   Amphetamines NONE DETECTED NONE DETECTED   Tetrahydrocannabinol NONE DETECTED NONE DETECTED   Barbiturates NONE DETECTED NONE DETECTED  Urinalysis, Routine w reflex microscopic Urine, Clean Catch     Status: Abnormal   Collection Time: 02/19/22  1:47 PM  Result Value Ref Range   Color, Urine YELLOW YELLOW   APPearance CLEAR CLEAR   Specific Gravity, Urine 1.011 1.005 - 1.030   pH 7.0 5.0 - 8.0   Glucose, UA NEGATIVE NEGATIVE mg/dL   Hgb urine dipstick SMALL (A) NEGATIVE   Bilirubin Urine NEGATIVE NEGATIVE   Ketones, ur NEGATIVE NEGATIVE mg/dL   Protein, ur NEGATIVE NEGATIVE mg/dL   Nitrite NEGATIVE NEGATIVE   Leukocytes,Ua NEGATIVE NEGATIVE   RBC / HPF 0-5 0 - 5 RBC/hpf   WBC, UA 0-5 0 - 5 WBC/hpf   Bacteria, UA NONE SEEN NONE SEEN   Squamous Epithelial / LPF 0-5 0 - 5   Mucus PRESENT   Ethanol     Status: None   Collection Time: 02/19/22  2:47 PM  Result Value Ref Range   Alcohol, Ethyl (B) <10 <10 mg/dL  Protime-INR     Status: None   Collection Time: 02/19/22  2:47 PM  Result Value Ref Range   Prothrombin Time 13.2 11.4 - 15.2 seconds   INR 1.0 0.8 - 1.2  APTT     Status: None   Collection Time: 02/19/22  2:47 PM  Result Value Ref Range   aPTT 28 24 - 36 seconds  CBC     Status: Abnormal   Collection Time: 02/19/22  2:47 PM  Result Value Ref Range   WBC 7.9 4.0 - 10.5 K/uL   RBC 5.87 (H) 4.22 - 5.81 MIL/uL   Hemoglobin 13.6 13.0 - 17.0 g/dL   HCT 43.9 39.0 - 52.0 %   MCV 74.8 (L) 80.0 - 100.0 fL   MCH 23.2 (L) 26.0 - 34.0 pg   MCHC 31.0 30.0 - 36.0 g/dL   RDW 16.3 (H) 11.5 - 15.5 %   Platelets 246 150 - 400 K/uL   nRBC 0.0 0.0 - 0.2 %  Differential     Status: Abnormal   Collection Time: 02/19/22  2:47 PM  Result Value Ref Range   Neutrophils Relative % 83 %   Neutro Abs 6.5 1.7 - 7.7 K/uL    Lymphocytes Relative 8 %   Lymphs Abs 0.6 (L) 0.7 - 4.0 K/uL   Monocytes Relative 9 %   Monocytes Absolute 0.7 0.1 - 1.0 K/uL   Eosinophils Relative 0 %   Eosinophils Absolute 0.0 0.0 - 0.5 K/uL   Basophils Relative 0 %   Basophils Absolute 0.0 0.0 - 0.1 K/uL   Immature Granulocytes 0 %   Abs Immature Granulocytes 0.02 0.00 - 0.07 K/uL  Comprehensive metabolic panel     Status: Abnormal  Collection Time: 02/19/22  2:47 PM  Result Value Ref Range   Sodium 140 135 - 145 mmol/L   Potassium 3.5 3.5 - 5.1 mmol/L   Chloride 105 98 - 111 mmol/L   CO2 26 22 - 32 mmol/L   Glucose, Bld 108 (H) 70 - 99 mg/dL   BUN 9 8 - 23 mg/dL   Creatinine, Ser 1.25 (H) 0.61 - 1.24 mg/dL   Calcium 8.9 8.9 - 10.3 mg/dL   Total Protein 8.2 (H) 6.5 - 8.1 g/dL   Albumin 4.5 3.5 - 5.0 g/dL   AST 18 15 - 41 U/L   ALT 11 0 - 44 U/L   Alkaline Phosphatase 70 38 - 126 U/L   Total Bilirubin 0.5 0.3 - 1.2 mg/dL   GFR, Estimated >60 >60 mL/min   Anion gap 9 5 - 15  Lactic acid, plasma     Status: Abnormal   Collection Time: 02/19/22  2:47 PM  Result Value Ref Range   Lactic Acid, Venous 2.1 (HH) 0.5 - 1.9 mmol/L  Blood Culture (routine x 2)     Status: None (Preliminary result)   Collection Time: 02/19/22  2:52 PM   Specimen: Left Antecubital; Blood  Result Value Ref Range   Specimen Description      LEFT ANTECUBITAL BOTTLES DRAWN AEROBIC AND ANAEROBIC   Special Requests      Blood Culture adequate volume Performed at [facility], [address], [place], [place] [address]    Culture PENDING    Report Status PENDING   Blood Culture (routine x 2)     Status: None (Preliminary result)   Collection Time: 02/19/22  2:52 PM   Specimen: Left Antecubital; Blood  Result Value Ref Range   Specimen Description      RIGHT ANTECUBITAL BOTTLES DRAWN AEROBIC AND ANAEROBIC   Special Requests      Blood Culture adequate volume Performed at [facility], [address], [place], [place] [address]    Culture PENDING     Report Status PENDING   Resp Panel by RT-PCR (Flu A&B, Covid) Nasopharyngeal Swab     Status: Abnormal   Collection Time: 02/19/22  3:03 PM   Specimen: Nasopharyngeal Swab; Nasopharyngeal(NP) swabs in vial transport medium  Result Value Ref Range   SARS Coronavirus 2 by RT PCR POSITIVE (A) NEGATIVE   Influenza A by PCR NEGATIVE NEGATIVE   Influenza B by PCR NEGATIVE NEGATIVE  Lactic acid, plasma     Status: None   Collection Time: 02/19/22  5:06 PM  Result Value Ref Range   Lactic Acid, Venous 1.7 0.5 - 1.9 mmol/L   DG Chest 2 View  Result Date: 02/19/2022 CLINICAL DATA:  Fever. EXAM: CHEST - 2 VIEW COMPARISON:  02/23/2021 FINDINGS: The heart size and mediastinal contours are within normal limits. Both lungs are clear. The visualized skeletal structures are unremarkable. IMPRESSION: No active cardiopulmonary disease. Electronically Signed   By: Marlaine Hind M.D.   On: 02/19/2022 14:49   CT HEAD WO CONTRAST  Result Date: 02/19/2022 CLINICAL DATA:  Transient vision loss EXAM: CT HEAD WITHOUT CONTRAST TECHNIQUE: Contiguous axial images were obtained from the base of the skull through the vertex without intravenous contrast. RADIATION DOSE REDUCTION: This exam was performed according to the departmental dose-optimization program which includes automated exposure control, adjustment of the mA and/or kV according to patient size and/or use of iterative reconstruction technique. COMPARISON:  02/01/2022 FINDINGS: Brain: No acute intracranial findings are seen in noncontrast CT  brain. Ventricles are not dilated. There is no shift of midline structures. There are no epidural or subdural fluid collections. There is no focal edema or mass effect. Vascular: Unremarkable. Skull: Unremarkable. Sinuses/Orbits: Visualized paranasal sinuses are unremarkable. Visualized portions of orbits are unremarkable. Other: No significant interval changes are noted. IMPRESSION: No acute intracranial findings are seen in  noncontrast CT brain. Electronically Signed   By: Elmer Picker M.D.   On: 02/19/2022 14:59     Assessment and Plan: No notes have been filed under this hospital service. Service: Hospitalist  Principal Problem:   Orthostasis Active Problems:   COVID-19 virus infection   AKI (acute kidney injury) [facility])  Patient discussed with EDP including presentation, history, hospital/emergency department course.  Orthostasis secondary to dehydration IV fluids overnight AKI Recheck creatinine in the morning after rehydration COVID 19 virus infection Patient not currently needing steroids or any other treatment.  The patient has been well outside the window for treatment.  We will continue with inhalers as at home   Advance Care Planning:   Code Status: Not on file full code  Consults: None  Family Communication: Son present during interview and exam  Severity of Illness: The appropriate patient status for this patient is OBSERVATION. Observation status is judged to be reasonable and necessary in order to provide the required intensity of service to ensure the patient's safety. The patient's presenting symptoms, physical exam findings, and initial radiographic and laboratory data in the context of their medical condition is felt to place them at decreased risk for further clinical deterioration. Furthermore, it is anticipated that the patient will be medically stable for discharge from the hospital within 2 midnights of admission.   Author: Truett Mainland, DO 02/19/2022 8:05 PM  For on call review [email or web address].

## 2022-02-19 NOTE — ED Triage Notes (Signed)
Patient c/o loss in both eyes bilaterally that started this morning upon waking at 8:30am. Per patient normal at 2100 last night when he went to sleep. Denies any weakness, slurred speech, or headache. Patient does report some dizziness, worse with position. Patient noted to have tremors, states they started 2 weeks ago. Temp 101.4. Denies HTN.

## 2022-02-20 ENCOUNTER — Encounter (HOSPITAL_COMMUNITY): Payer: Self-pay | Admitting: Family Medicine

## 2022-02-20 DIAGNOSIS — J449 Chronic obstructive pulmonary disease, unspecified: Secondary | ICD-10-CM

## 2022-02-20 DIAGNOSIS — H538 Other visual disturbances: Secondary | ICD-10-CM | POA: Diagnosis present

## 2022-02-20 DIAGNOSIS — J302 Other seasonal allergic rhinitis: Secondary | ICD-10-CM | POA: Diagnosis present

## 2022-02-20 DIAGNOSIS — Z87891 Personal history of nicotine dependence: Secondary | ICD-10-CM | POA: Diagnosis not present

## 2022-02-20 DIAGNOSIS — U071 COVID-19: Secondary | ICD-10-CM | POA: Diagnosis present

## 2022-02-20 DIAGNOSIS — N179 Acute kidney failure, unspecified: Secondary | ICD-10-CM | POA: Diagnosis present

## 2022-02-20 DIAGNOSIS — I951 Orthostatic hypotension: Secondary | ICD-10-CM | POA: Diagnosis present

## 2022-02-20 DIAGNOSIS — E860 Dehydration: Secondary | ICD-10-CM | POA: Diagnosis present

## 2022-02-20 DIAGNOSIS — H53123 Transient visual loss, bilateral: Secondary | ICD-10-CM | POA: Diagnosis present

## 2022-02-20 LAB — CBC
HCT: 36.3 % — ABNORMAL LOW (ref 39.0–52.0)
Hemoglobin: 11.5 g/dL — ABNORMAL LOW (ref 13.0–17.0)
MCH: 23.2 pg — ABNORMAL LOW (ref 26.0–34.0)
MCHC: 31.7 g/dL (ref 30.0–36.0)
MCV: 73.2 fL — ABNORMAL LOW (ref 80.0–100.0)
Platelets: 205 10*3/uL (ref 150–400)
RBC: 4.96 MIL/uL (ref 4.22–5.81)
RDW: 15.5 % (ref 11.5–15.5)
WBC: 5.2 10*3/uL (ref 4.0–10.5)
nRBC: 0 % (ref 0.0–0.2)

## 2022-02-20 LAB — BASIC METABOLIC PANEL
Anion gap: 5 (ref 5–15)
BUN: 10 mg/dL (ref 8–23)
CO2: 27 mmol/L (ref 22–32)
Calcium: 8.4 mg/dL — ABNORMAL LOW (ref 8.9–10.3)
Chloride: 108 mmol/L (ref 98–111)
Creatinine, Ser: 0.96 mg/dL (ref 0.61–1.24)
GFR, Estimated: >60 mL/min (ref >60)
Glucose, Bld: 92 mg/dL (ref 70–99)
Potassium: 3.5 mmol/L (ref 3.5–5.1)
Sodium: 140 mmol/L (ref 135–145)

## 2022-02-20 LAB — HIV ANTIBODY (ROUTINE TESTING W REFLEX): HIV Screen 4th Generation wRfx: NONREACTIVE

## 2022-02-20 MED ORDER — LACTATED RINGERS IV SOLN: INTRAVENOUS | Status: DC

## 2022-02-20 MED ORDER — ZINC SULFATE 220 (50 ZN) MG PO CAPS
220.0000 mg | ORAL_CAPSULE | Freq: Every day | ORAL | Status: DC
  Administered 2022-02-20 – 2022-02-21 (×2): 220 mg via ORAL
  Filled 2022-02-20 (×2): qty 1

## 2022-02-20 MED ORDER — ASCORBIC ACID 500 MG PO TABS
500.0000 mg | ORAL_TABLET | Freq: Every day | ORAL | Status: DC
Start: 2022-02-20 — End: 2022-02-21
  Administered 2022-02-20 – 2022-02-21 (×2): 500 mg via ORAL
  Filled 2022-02-20 (×2): qty 1

## 2022-02-20 NOTE — ED Notes (Signed)
Pt refused breakfast.  Sts he's not hungry.

## 2022-02-20 NOTE — Progress Notes (Signed)
PROGRESS NOTE    Dennis Lane  SFK:[id number] DOB: October 31, 1960 DOA: 02/19/2022 PCP: Bronson Curb, PA-C    Brief Narrative:  Dennis Lane is a 61 y.o. male with past medical history of COPD hospital with increasing shortness of breath and cough for several weeks with episodes of presyncope.  In the ED, he was noted to have  fever of 101.4 F with mild tachycardia and blood pressure was marginally low but pulse ox was 97% on room air.  WBC was 7.9 lactate was mildly elevated at 2.1.  Creatinine slightly elevated at 1.2 with normal LFTs.  Lactate was 1.7.  UA was unremarkable.  UDS was negative.  Influenza and COVID screen was sent from the ED and patient tested positive for COVID.  Patient was then considered for admission to the hospital for further evaluation and treatment.  Assessment and plan.  Principal Problem:   Orthostasis Active Problems:   COVID-19 virus infection   AKI (acute kidney injury) ([place])   Orthostasis secondary to volume depletion. Received IV fluids overnight.  Patient is still orthostatic with blood pressures dropping to 80s on standing up.  We will continue with IV fluid hydration today.  Do orthostatic vitals every shift.  Mild AKI on presentation.  Baseline creatinine around 1.0.  Has improved with IV fluids.  COVID 19 virus infection We will continue supportive care.  Outside window for antiviral treatment.  Not hypoxic so not on steroids..  Add vitamin C, zinc, bronchodilators and spirometry.  Add antitussives.  Blood cultures negative in 12 hours.  Patient initially received septic IV fluid bolus and cefepime and metronidazole.  Off antibiotic at this time. Currently on Ringer lactate 150 mL/h.  Continue for 1 day due to orthostatic hypotension.  Temperature max was 101.4 F.  Possible history of COPD.  Used to smoke in the past.  Uncertain about the diagnosis.  Continue bronchodilators.  No overt wheezes noted.    DVT prophylaxis: enoxaparin (LOVENOX)  injection 40 mg Start: 02/19/22 2315   Code Status:     Code Status: Full Code  Disposition: Home likely by tomorrow if he clinically improves and orthostatic hypotension gets better.  Status is: Observation  The patient will require care spanning > 2 midnights and should be moved to inpatient because: Orthostatic  hypotension requiring IV fluids, closer monitoring.   Family Communication: Communicated with the patient's wife at bedside.  Consultants:  None  Procedures:  None  Antimicrobials:  None  Subjective: Today, patient was seen and examined at bedside.  Patient's wife at bedside.  Patient states that he does not feel well and feels weak and deconditioned.  Nursing staff tried to stand him up and his blood pressure dropped in the 80s.  Complains of ongoing cough with some productive sputum.    Objective: Vitals:   02/20/22 1000 02/20/22 1030 02/20/22 1047 02/20/22 1109  BP: 124/80 124/86  130/76  Pulse: 60 (!) 56  (!) 50  Resp: '20 15  18  '$ Temp:   98.9 F (37.2 C) 99.8 F (37.7 C)  TempSrc:   Oral Oral  SpO2: 98% 99%  100%  Weight:    72.6 kg  Height:    '5\' 8"'$  (1.727 m)    Intake/Output Summary (Last 24 hours) at 02/20/2022 1113 Last data filed at 02/19/2022 2033 Gross per 24 hour  Intake 1000 ml  Output --  Net 1000 ml   Filed Weights   02/19/22 1234 02/20/22 1109  Weight: 72.6 kg 72.6 kg  Physical Examination:  General:  Average built, not in obvious distress HENT:   No scleral pallor or icterus noted. Oral mucosa is moist.  Chest:  Diminished breath sounds bilaterally.  Coarse breath sounds noted. CVS: S1 &S2 heard. No murmur.  Regular rate and rhythm. Abdomen: Soft, nontender, nondistended.  Bowel sounds are heard.   Extremities: No cyanosis, clubbing or edema.  Peripheral pulses are palpable. Psych: Alert, awake and oriented, normal mood CNS:  No cranial nerve deficits.  Power equal in all extremities.   Skin: Warm and dry.  No rashes  noted.  Data Reviewed:   CBC: Recent Labs  Lab 02/19/22 1447 02/20/22 0602  WBC 7.9 5.2  NEUTROABS 6.5  --   HGB 13.6 11.5*  HCT 43.9 36.3*  MCV 74.8* 73.2*  PLT 246 814    Basic Metabolic Panel: Recent Labs  Lab 02/19/22 1447 02/20/22 0602  NA 140 140  K 3.5 3.5  CL 105 108  CO2 26 27  GLUCOSE 108* 92  BUN 9 10  CREATININE 1.25* 0.96  CALCIUM 8.9 8.4*    Liver Function Tests: Recent Labs  Lab 02/19/22 1447  AST 18  ALT 11  ALKPHOS 70  BILITOT 0.5  PROT 8.2*  ALBUMIN 4.5     Radiology Studies: DG Chest 2 View  Result Date: 02/19/2022 CLINICAL DATA:  Fever. EXAM: CHEST - 2 VIEW COMPARISON:  02/23/2021 FINDINGS: The heart size and mediastinal contours are within normal limits. Both lungs are clear. The visualized skeletal structures are unremarkable. IMPRESSION: No active cardiopulmonary disease. Electronically Signed   By: Marlaine Hind M.D.   On: 02/19/2022 14:49   CT HEAD WO CONTRAST  Result Date: 02/19/2022 CLINICAL DATA:  Transient vision loss EXAM: CT HEAD WITHOUT CONTRAST TECHNIQUE: Contiguous axial images were obtained from the base of the skull through the vertex without intravenous contrast. RADIATION DOSE REDUCTION: This exam was performed according to the departmental dose-optimization program which includes automated exposure control, adjustment of the mA and/or kV according to patient size and/or use of iterative reconstruction technique. COMPARISON:  02/01/2022 FINDINGS: Brain: No acute intracranial findings are seen in noncontrast CT brain. Ventricles are not dilated. There is no shift of midline structures. There are no epidural or subdural fluid collections. There is no focal edema or mass effect. Vascular: Unremarkable. Skull: Unremarkable. Sinuses/Orbits: Visualized paranasal sinuses are unremarkable. Visualized portions of orbits are unremarkable. Other: No significant interval changes are noted. IMPRESSION: No acute intracranial findings are seen  in noncontrast CT brain. Electronically Signed   By: Elmer Picker M.D.   On: 02/19/2022 14:59      LOS: 0 days    Flora Lipps, MD [facility] Available via Epic secure chat 7am-7pm After these hours, please refer to coverage provider listed on [email or web address] 02/20/2022, 11:13 AM

## 2022-02-20 NOTE — Hospital Course (Addendum)
Corde Antonini is a 61 y.o. male with past medical history of COPD hospital with increasing shortness of breath and cough for several weeks with episodes of presyncope.  In the ED, he was noted to have  fever of 101.4 F with mild tachycardia and blood pressure was marginally low but pulse ox was 97% on room air.  WBC was 7.9 lactate was mildly elevated at 2.1.  Creatinine slightly elevated at 1.2 with normal LFTs.  Lactate was 1.7.  UA was unremarkable.  UDS was negative.  Patient tested positive for COVID.  Patient was then considered for admission to the hospital for further evaluation and treatment.  Assessment and plan.  Principal Problem:   Orthostasis Active Problems:   COVID-19 virus infection   AKI (acute kidney injury) ([place])   Orthostasis secondary to volume depletion. Received IV fluids during hospitalization with improvement of his blood pressure and was asymptomatic at the time of discharge.  Increased oral hydration on discharge.    Mild AKI on presentation.  Baseline creatinine around 1.0.  Has improved with IV fluids.  Creatinine prior to discharge was 0.8.  COVID 19 virus infection Mild cough without any hypoxia.  Outside window for antiviral treatment.  Not hypoxic so was not prescribed steroids.  Continue vitamin C, zinc, bronchodilators and spirometry, antitussives.  Blood cultures negative in 2 days.  Patient initially received septic IV fluid bolus and cefepime and metronidazole.  Afebrile prior to discharge.  Patient feels clinically improved.  Possible history of COPD.  Used to smoke in the past.   Continue bronchodilators from home..  No overt wheezes noted.

## 2022-02-20 NOTE — ED Notes (Signed)
During orthostatic's pt stated that his whole body was feeling weak and unsteady. Pt was a little shaky in his legs while standing.

## 2022-02-20 NOTE — Progress Notes (Signed)
   02/20/22 1912  Orthostatic Lying   BP- Lying 137/89  Pulse- Lying 50  Orthostatic Sitting  BP- Sitting 130/89  Pulse- Sitting 66  Orthostatic Standing at 0 minutes  BP- Standing at 0 minutes 116/82  Pulse- Standing at 0 minutes 81  Orthostatic Standing at 3 minutes  BP- Standing at 3 minutes 115/88  Pulse- Standing at 3 minutes 86

## 2022-02-20 NOTE — ED Notes (Signed)
All belongings placed in Pt belongings bags including clothing, shoes, and 2 phones (2 bags).

## 2022-02-21 DIAGNOSIS — U071 COVID-19: Secondary | ICD-10-CM | POA: Diagnosis not present

## 2022-02-21 DIAGNOSIS — N179 Acute kidney failure, unspecified: Secondary | ICD-10-CM | POA: Diagnosis not present

## 2022-02-21 DIAGNOSIS — I951 Orthostatic hypotension: Secondary | ICD-10-CM | POA: Diagnosis not present

## 2022-02-21 LAB — COMPREHENSIVE METABOLIC PANEL
ALT: 10 U/L (ref 0–44)
AST: 17 U/L (ref 15–41)
Albumin: 3.1 g/dL — ABNORMAL LOW (ref 3.5–5.0)
Alkaline Phosphatase: 44 U/L (ref 38–126)
Anion gap: 4 — ABNORMAL LOW (ref 5–15)
BUN: 8 mg/dL (ref 8–23)
CO2: 27 mmol/L (ref 22–32)
Calcium: 8.3 mg/dL — ABNORMAL LOW (ref 8.9–10.3)
Chloride: 107 mmol/L (ref 98–111)
Creatinine, Ser: 0.86 mg/dL (ref 0.61–1.24)
GFR, Estimated: >60 mL/min (ref >60)
Glucose, Bld: 89 mg/dL (ref 70–99)
Potassium: 3.3 mmol/L — ABNORMAL LOW (ref 3.5–5.1)
Sodium: 138 mmol/L (ref 135–145)
Total Bilirubin: 0.4 mg/dL (ref 0.3–1.2)
Total Protein: 6.1 g/dL — ABNORMAL LOW (ref 6.5–8.1)

## 2022-02-21 LAB — CBC
HCT: 37.4 % — ABNORMAL LOW (ref 39.0–52.0)
Hemoglobin: 11.3 g/dL — ABNORMAL LOW (ref 13.0–17.0)
MCH: 22.2 pg — ABNORMAL LOW (ref 26.0–34.0)
MCHC: 30.2 g/dL (ref 30.0–36.0)
MCV: 73.5 fL — ABNORMAL LOW (ref 80.0–100.0)
Platelets: 177 10*3/uL (ref 150–400)
RBC: 5.09 MIL/uL (ref 4.22–5.81)
RDW: 15 % (ref 11.5–15.5)
WBC: 3.9 10*3/uL — ABNORMAL LOW (ref 4.0–10.5)
nRBC: 0 % (ref 0.0–0.2)

## 2022-02-21 LAB — MAGNESIUM: Magnesium: 1.7 mg/dL (ref 1.7–2.4)

## 2022-02-21 MED ORDER — POTASSIUM CHLORIDE CRYS ER 20 MEQ PO TBCR
40.0000 meq | EXTENDED_RELEASE_TABLET | Freq: Once | ORAL | Status: AC
  Administered 2022-02-21: 40 meq via ORAL
  Filled 2022-02-21: qty 2

## 2022-02-21 MED ORDER — ASCORBIC ACID 500 MG PO TABS
500.0000 mg | ORAL_TABLET | Freq: Every day | ORAL | 0 refills | Status: AC
Start: 2022-02-22 — End: 2022-03-24

## 2022-02-21 MED ORDER — ZINC SULFATE 220 (50 ZN) MG PO CAPS: 220.0000 mg | ORAL_CAPSULE | Freq: Every day | ORAL | 0 refills | Status: AC

## 2022-02-21 MED ORDER — GUAIFENESIN ER 600 MG PO TB12: 600.0000 mg | ORAL_TABLET | Freq: Two times a day (BID) | ORAL | 0 refills | Status: AC

## 2022-02-21 NOTE — TOC Progression Note (Signed)
  Transition of Care [facility]) Screening Note   Patient Details  Name: Dennis Lane Date of Birth: 03/29/1961   Transition of Care [facility]) CM/SW Contact:    Shade Flood, LCSW Phone Number: 02/21/2022, 9:32 AM    Transition of Care Department [facility]) has reviewed patient and no TOC needs have been identified at this time. We will continue to monitor patient advancement through interdisciplinary progression rounds. If new patient transition needs arise, please place a TOC consult.

## 2022-02-21 NOTE — Progress Notes (Signed)
   02/21/22 0900  Orthostatic Lying   BP- Lying 123/80  Pulse- Lying 55  Orthostatic Sitting  BP- Sitting 124/84  Pulse- Sitting 85  Orthostatic Standing at 0 minutes  BP- Standing at 0 minutes 104/74  Pulse- Standing at 0 minutes 74  Orthostatic Standing at 3 minutes  BP- Standing at 3 minutes 100/77  Pulse- Standing at 3 minutes 95  Oxygen Therapy  SpO2 100 %  O2 Device Room Air

## 2022-02-21 NOTE — Discharge Summary (Signed)
Physician Discharge Summary  Dennis Lane VZC:[id number] DOB: 16-Sep-1961 DOA: 02/19/2022  PCP: Bronson Curb, PA-C  Admit date: 02/19/2022 Discharge date: 02/21/2022  Admitted From: Home  Discharge disposition: Home  Recommendations for Outpatient Follow-Up:   Follow up with your primary care provider in one week.  Check CBC, BMP, magnesium in the next visit  Discharge Diagnosis:   Principal Problem:   Orthostasis Active Problems:   COVID-19 virus infection   AKI (acute kidney injury) ([place])   Orthostatic hypotension   Discharge Condition: Improved.  Diet recommendation:   Regular.  Wound care: None.  Code status: Full.   History of Present Illness:   Dennis Lane is a 61 y.o. male with past medical history of COPD presented to the hospital with increasing shortness of breath and cough for several weeks with episodes of presyncope.  In the ED, patient was noted to have  fever of 101.4 F with mild tachycardia and blood pressure was marginally low but pulse ox was 97% on room air.  WBC was 7.9 lactate was mildly elevated at 2.1.  Creatinine slightly elevated at 1.2 with normal LFTs.  Lactate was 1.7.  UA was unremarkable.  UDS was negative.  Patient tested positive for COVID.  Patient was then considered for admission to the hospital for further evaluation and treatment.  Hospital Course:   Following conditions were addressed during hospitalization as listed below,   Orthostasis secondary to volume depletion. Resolved at this time.  Received IV fluids during hospitalization with improvement of his blood pressure and was asymptomatic at the time of discharge.  Patient was encouraged to increase oral hydration on discharge.    Mild AKI on presentation.  Resolved at this time baseline creatinine around 1.0.  Has improved with IV fluids.  Creatinine prior to discharge was 0.8.  COVID 19 virus infection Mild cough without any hypoxia.  Outside window for antiviral  treatment.  Not hypoxic so was not prescribed steroids.  Continue vitamin C, zinc, bronchodilators and spirometry, antitussives.  Blood cultures negative in 2 days.  Patient initially received septic IV fluid bolus and cefepime and metronidazole.  Afebrile prior to discharge.  Patient feels clinically improved.  Possible history of COPD.  Used to smoke in the past.   Continue bronchodilators from home.  No overt wheezes noted.  Disposition.  At this time, patient is stable for disposition home.  Communicated with the patient's wife at bedside.  Medical Consultants:   None.  Procedures:    None Subjective:   Today, patient was seen and examined at bedside.  Feels better.  Wishes to go home.  Orthostatic vitals were negative.  Did not have any dizziness lightheadedness.  Mild cough.  Discharge Exam:   Vitals:   02/21/22 0828 02/21/22 0900  BP:    Pulse:    Resp:    Temp:    SpO2: 98% 100%   Vitals:   02/20/22 2328 02/21/22 0332 02/21/22 0828 02/21/22 0900  BP: 122/71 130/85    Pulse: 79 (!) 55    Resp: 18 19    Temp: 98.9 F (37.2 C) 98.7 F (37.1 C)    TempSrc: Oral Oral    SpO2:  99% 98% 100%  Weight:      Height:        General: Alert awake, not in obvious distress HENT: pupils equally reacting to light,  No scleral pallor or icterus noted. Oral mucosa is moist.  Chest:   Diminished breath sounds bilaterally, coarse  breath sounds noted, no wheezes.    CVS: S1 &S2 heard. No murmur.  Regular rate and rhythm. Abdomen: Soft, nontender, nondistended.  Bowel sounds are heard.   Extremities: No cyanosis, clubbing or edema.  Peripheral pulses are palpable. Psych: Alert, awake and oriented, normal mood CNS:  No cranial nerve deficits.  Power equal in all extremities.   Skin: Warm and dry.  No rashes noted.  The results of significant diagnostics from this hospitalization (including imaging, microbiology, ancillary and laboratory) are listed below for reference.      Diagnostic Studies:   DG Chest 2 View  Result Date: 02/19/2022 CLINICAL DATA:  Fever. EXAM: CHEST - 2 VIEW COMPARISON:  02/23/2021 FINDINGS: The heart size and mediastinal contours are within normal limits. Both lungs are clear. The visualized skeletal structures are unremarkable. IMPRESSION: No active cardiopulmonary disease. Electronically Signed   By: Marlaine Hind M.D.   On: 02/19/2022 14:49   CT HEAD WO CONTRAST  Result Date: 02/19/2022 CLINICAL DATA:  Transient vision loss EXAM: CT HEAD WITHOUT CONTRAST TECHNIQUE: Contiguous axial images were obtained from the base of the skull through the vertex without intravenous contrast. RADIATION DOSE REDUCTION: This exam was performed according to the departmental dose-optimization program which includes automated exposure control, adjustment of the mA and/or kV according to patient size and/or use of iterative reconstruction technique. COMPARISON:  02/01/2022 FINDINGS: Brain: No acute intracranial findings are seen in noncontrast CT brain. Ventricles are not dilated. There is no shift of midline structures. There are no epidural or subdural fluid collections. There is no focal edema or mass effect. Vascular: Unremarkable. Skull: Unremarkable. Sinuses/Orbits: Visualized paranasal sinuses are unremarkable. Visualized portions of orbits are unremarkable. Other: No significant interval changes are noted. IMPRESSION: No acute intracranial findings are seen in noncontrast CT brain. Electronically Signed   By: Elmer Picker M.D.   On: 02/19/2022 14:59     Labs:   Basic Metabolic Panel: Recent Labs  Lab 02/19/22 1447 02/20/22 0602 02/21/22 0417  NA 140 140 138  K 3.5 3.5 3.3*  CL 105 108 107  CO2 '26 27 27  '$ GLUCOSE 108* 92 89  BUN '9 10 8  '$ CREATININE 1.25* 0.96 0.86  CALCIUM 8.9 8.4* 8.3*  MG  --   --  1.7   GFR Estimated Creatinine Clearance: 86.2 mL/min (by C-G formula based on SCr of 0.86 mg/dL). Liver Function Tests: Recent Labs   Lab 02/19/22 1447 02/21/22 0417  AST 18 17  ALT 11 10  ALKPHOS 70 44  BILITOT 0.5 0.4  PROT 8.2* 6.1*  ALBUMIN 4.5 3.1*   No results for input(s): LIPASE, AMYLASE in the last 168 hours. No results for input(s): AMMONIA in the last 168 hours. Coagulation profile Recent Labs  Lab 02/19/22 1447  INR 1.0    CBC: Recent Labs  Lab 02/19/22 1447 02/20/22 0602 02/21/22 0417  WBC 7.9 5.2 3.9*  NEUTROABS 6.5  --   --   HGB 13.6 11.5* 11.3*  HCT 43.9 36.3* 37.4*  MCV 74.8* 73.2* 73.5*  PLT 246 205 177   Cardiac Enzymes: No results for input(s): CKTOTAL, CKMB, CKMBINDEX, TROPONINI in the last 168 hours. BNP: Invalid input(s): POCBNP CBG: Recent Labs  Lab 02/19/22 1236  GLUCAP 93   D-Dimer No results for input(s): DDIMER in the last 72 hours. Hgb A1c No results for input(s): HGBA1C in the last 72 hours. Lipid Profile No results for input(s): CHOL, HDL, LDLCALC, TRIG, CHOLHDL, LDLDIRECT in the last 72 hours. Thyroid  function studies No results for input(s): TSH, T4TOTAL, T3FREE, THYROIDAB in the last 72 hours.  Invalid input(s): FREET3 Anemia work up No results for input(s): VITAMINB12, FOLATE, FERRITIN, TIBC, IRON, RETICCTPCT in the last 72 hours. Microbiology Recent Results (from the past 240 hour(s))  Blood Culture (routine x 2)     Status: None (Preliminary result)   Collection Time: 02/19/22  2:52 PM   Specimen: Left Antecubital; Blood  Result Value Ref Range Status   Specimen Description   Final    LEFT ANTECUBITAL BOTTLES DRAWN AEROBIC AND ANAEROBIC   Special Requests Blood Culture adequate volume  Final   Culture   Final    NO GROWTH 2 DAYS Performed at [facility], [address] [place], [place] [address]    Report Status PENDING  Incomplete  Blood Culture (routine x 2)     Status: None (Preliminary result)   Collection Time: 02/19/22  2:52 PM   Specimen: Right Antecubital; Blood  Result Value Ref Range Status   Specimen Description   Final     RIGHT ANTECUBITAL BOTTLES DRAWN AEROBIC AND ANAEROBIC   Special Requests Blood Culture adequate volume  Final   Culture   Final    NO GROWTH 2 DAYS Performed at [facility], [address] [place], [place] [address]    Report Status PENDING  Incomplete  Resp Panel by RT-PCR (Flu A&B, Covid) Nasopharyngeal Swab     Status: Abnormal   Collection Time: 02/19/22  3:03 PM   Specimen: Nasopharyngeal Swab; Nasopharyngeal(NP) swabs in vial transport medium  Result Value Ref Range Status   SARS Coronavirus 2 by RT PCR POSITIVE (A) NEGATIVE Final    Comment: (NOTE) SARS-CoV-2 target nucleic acids are DETECTED.  The SARS-CoV-2 RNA is generally detectable in upper respiratory specimens during the acute phase of infection. Positive results are indicative of the presence of the identified virus, but do not rule out bacterial infection or co-infection with other pathogens not detected by the test. Clinical correlation with patient history and other diagnostic information is necessary to determine patient infection status. The expected result is Negative.  Fact Sheet for Patients: EntrepreneurPulse.com.au  Fact Sheet for Healthcare Providers: IncredibleEmployment.be  This test is not yet approved or cleared by the [place] FDA and  has been authorized for detection and/or diagnosis of SARS-CoV-2 by FDA under an Emergency Use Authorization (EUA).  This EUA will remain in effect (meaning this test can be used) for the duration of  the COVID-19 declaration under Section 564(b)(1) of the A ct, 21 U.S.C. section 360bbb-3(b)(1), unless the authorization is terminated or revoked sooner.     Influenza A by PCR NEGATIVE NEGATIVE Final   Influenza B by PCR NEGATIVE NEGATIVE Final    Comment: (NOTE) The Xpert Xpress SARS-CoV-2/FLU/RSV plus assay is intended as an aid in the diagnosis of influenza from Nasopharyngeal swab specimens and should not be used as a  sole basis for treatment. Nasal washings and aspirates are unacceptable for Xpert Xpress SARS-CoV-2/FLU/RSV testing.  Fact Sheet for Patients: EntrepreneurPulse.com.au  Fact Sheet for Healthcare Providers: IncredibleEmployment.be  This test is not yet approved or cleared by the [place] FDA and has been authorized for detection and/or diagnosis of SARS-CoV-2 by FDA under an Emergency Use Authorization (EUA). This EUA will remain in effect (meaning this test can be used) for the duration of the COVID-19 declaration under Section 564(b)(1) of the Act, 21 U.S.C. section 360bbb-3(b)(1), unless the authorization is terminated or revoked.  Performed at  [address] [place], [place] [address]      Discharge Instructions:   Discharge Instructions     Diet general   Complete by: As directed    Discharge instructions   Complete by: As directed    Increase hydration. Take time to change positions especially from lying to standing position.  Seek medical attention for worsening symptoms.  Follow-up with your primary care provider in 1 week.   Increase activity slowly   Complete by: As directed       Allergies as of 02/21/2022   No Known Allergies      Medication List     TAKE these medications    Advair HFA 45-21 MCG/ACT inhaler Generic drug: fluticasone-salmeterol Inhale 2 puffs into the lungs 2 (two) times daily.   ascorbic acid 500 MG tablet Commonly known as: VITAMIN C Take 1 tablet (500 mg total) by mouth daily. Start taking on: Feb 22, 2022   fluticasone 50 MCG/ACT nasal spray Commonly known as: FLONASE Place 2 sprays into both nostrils daily.   GOODYS BODY PAIN PO Take 1 packet by mouth daily as needed (pain,headache.).   guaiFENesin 600 MG 12 hr tablet Commonly known as: MUCINEX Take 1 tablet (600 mg total) by mouth 2 (two) times daily for 10 days.   ProAir HFA 108 (90 Base) MCG/ACT inhaler Generic  drug: albuterol Inhale 1-2 puffs into the lungs every 6 (six) hours as needed for wheezing or shortness of breath.   zinc sulfate 220 (50 Zn) MG capsule Take 1 capsule (220 mg total) by mouth daily. Start taking on: Feb 22, 2022          Time coordinating discharge: 39 minutes  Signed:  Garron Eline  [facility] 02/21/2022, 9:32 AM

## 2022-02-24 LAB — CULTURE, BLOOD (ROUTINE X 2)
Culture: NO GROWTH
Culture: NO GROWTH
Special Requests: ADEQUATE
Special Requests: ADEQUATE

## 2022-02-28 ENCOUNTER — Encounter (HOSPITAL_COMMUNITY): Payer: Self-pay | Admitting: *Deleted

## 2022-02-28 ENCOUNTER — Other Ambulatory Visit: Payer: Self-pay

## 2022-02-28 ENCOUNTER — Emergency Department (HOSPITAL_COMMUNITY): Payer: BC Managed Care – PPO

## 2022-02-28 ENCOUNTER — Emergency Department (HOSPITAL_COMMUNITY)
Admission: EM | Admit: 2022-02-28 | Discharge: 2022-02-28 | Disposition: A | Discharge status: Home / Self Care | Payer: BC Managed Care – PPO | Attending: Emergency Medicine | Admitting: Emergency Medicine

## 2022-02-28 DIAGNOSIS — J449 Chronic obstructive pulmonary disease, unspecified: Secondary | ICD-10-CM | POA: Insufficient documentation

## 2022-02-28 DIAGNOSIS — R059 Cough, unspecified: Secondary | ICD-10-CM | POA: Insufficient documentation

## 2022-02-28 DIAGNOSIS — R0602 Shortness of breath: Secondary | ICD-10-CM | POA: Diagnosis present

## 2022-02-28 DIAGNOSIS — R1084 Generalized abdominal pain: Secondary | ICD-10-CM | POA: Insufficient documentation

## 2022-02-28 DIAGNOSIS — Z7951 Long term (current) use of inhaled steroids: Secondary | ICD-10-CM | POA: Insufficient documentation

## 2022-02-28 DIAGNOSIS — R531 Weakness: Secondary | ICD-10-CM | POA: Diagnosis not present

## 2022-02-28 DIAGNOSIS — R110 Nausea: Secondary | ICD-10-CM | POA: Diagnosis not present

## 2022-02-28 DIAGNOSIS — Z8616 Personal history of COVID-19: Secondary | ICD-10-CM | POA: Insufficient documentation

## 2022-02-28 HISTORY — DX: COVID-19: U07.1

## 2022-02-28 LAB — URINALYSIS, ROUTINE W REFLEX MICROSCOPIC
Bilirubin Urine: NEGATIVE
Glucose, UA: NEGATIVE mg/dL
Hgb urine dipstick: NEGATIVE
Ketones, ur: NEGATIVE mg/dL
Leukocytes,Ua: NEGATIVE
Nitrite: NEGATIVE
Protein, ur: NEGATIVE mg/dL
Specific Gravity, Urine: 1.018 (ref 1.005–1.030)
pH: 6 (ref 5.0–8.0)

## 2022-02-28 LAB — COMPREHENSIVE METABOLIC PANEL
ALT: 11 U/L (ref 0–44)
AST: 13 U/L — ABNORMAL LOW (ref 15–41)
Albumin: 4.4 g/dL (ref 3.5–5.0)
Alkaline Phosphatase: 57 U/L (ref 38–126)
Anion gap: 5 (ref 5–15)
BUN: 7 mg/dL — ABNORMAL LOW (ref 8–23)
CO2: 26 mmol/L (ref 22–32)
Calcium: 9.1 mg/dL (ref 8.9–10.3)
Chloride: 107 mmol/L (ref 98–111)
Creatinine, Ser: 0.88 mg/dL (ref 0.61–1.24)
GFR, Estimated: >60 mL/min (ref >60)
Glucose, Bld: 96 mg/dL (ref 70–99)
Potassium: 3.8 mmol/L (ref 3.5–5.1)
Sodium: 138 mmol/L (ref 135–145)
Total Bilirubin: 0.1 mg/dL — ABNORMAL LOW (ref 0.3–1.2)
Total Protein: 8.3 g/dL — ABNORMAL HIGH (ref 6.5–8.1)

## 2022-02-28 LAB — CBC
HCT: 40.3 % (ref 39.0–52.0)
Hemoglobin: 12.7 g/dL — ABNORMAL LOW (ref 13.0–17.0)
MCH: 23 pg — ABNORMAL LOW (ref 26.0–34.0)
MCHC: 31.5 g/dL (ref 30.0–36.0)
MCV: 73 fL — ABNORMAL LOW (ref 80.0–100.0)
Platelets: 341 10*3/uL (ref 150–400)
RBC: 5.52 MIL/uL (ref 4.22–5.81)
RDW: 15 % (ref 11.5–15.5)
WBC: 5.2 10*3/uL (ref 4.0–10.5)
nRBC: 0 % (ref 0.0–0.2)

## 2022-02-28 LAB — LIPASE, BLOOD: Lipase: 24 U/L (ref 11–51)

## 2022-02-28 MED ORDER — ACETAMINOPHEN 500 MG PO TABS
1000.0000 mg | ORAL_TABLET | Freq: Once | ORAL | Status: AC
  Administered 2022-02-28: 1000 mg via ORAL
  Filled 2022-02-28: qty 2

## 2022-02-28 MED ORDER — IPRATROPIUM-ALBUTEROL 0.5-2.5 (3) MG/3ML IN SOLN
3.0000 mL | Freq: Once | RESPIRATORY_TRACT | Status: AC
  Administered 2022-02-28: 3 mL via RESPIRATORY_TRACT
  Filled 2022-02-28: qty 3

## 2022-02-28 MED ORDER — ONDANSETRON HCL 4 MG PO TABS: 4.0000 mg | ORAL_TABLET | Freq: Four times a day (QID) | ORAL | 0 refills | Status: DC

## 2022-02-28 MED ORDER — SODIUM CHLORIDE 0.9 % IV BOLUS
500.0000 mL | Freq: Once | INTRAVENOUS | Status: AC
  Administered 2022-02-28: 500 mL via INTRAVENOUS

## 2022-02-28 MED ORDER — METHYLPREDNISOLONE SODIUM SUCC 125 MG IJ SOLR
125.0000 mg | Freq: Once | INTRAMUSCULAR | Status: AC
  Administered 2022-02-28: 125 mg via INTRAVENOUS
  Filled 2022-02-28: qty 2

## 2022-02-28 MED ORDER — ONDANSETRON HCL 4 MG/2ML IJ SOLN
4.0000 mg | Freq: Once | INTRAMUSCULAR | Status: AC
  Administered 2022-02-28: 4 mg via INTRAVENOUS
  Filled 2022-02-28: qty 2

## 2022-02-28 MED ORDER — PREDNISONE 20 MG PO TABS: 60.0000 mg | ORAL_TABLET | Freq: Every day | ORAL | 0 refills | Status: AC

## 2022-02-28 NOTE — Discharge Instructions (Addendum)
Will prescribe burst of steroids given increasing issues with asthma exacerbations and prescribe Zofran for nausea.  Encourage patient to and close PCP follow-up.  Take steroids for the next 5 days this should help decrease inflammation in your lungs.  You can continue to use your inhaler as needed.  Use Zofran as needed for nausea and vomiting.  Hopefully as your nausea improves your appetite will improve as well.  Your symptoms may be due to to long COVID syndrome, certain percentage of patients who have COVID infection have been found to have longer more prolonged symptoms.  Please follow-up closely with your primary care doctor.  Return to the emergency department for new or worsening symptoms.

## 2022-02-28 NOTE — ED Notes (Addendum)
Registration notified triage that pt with sob, go out to waiting room with pt, pt having an asthma attack and had used his inhaler.  Pt taken to room 10 and pt's breathing much better since use of his inhaler.  Pt undressed and placed in a gown, RA sats 94-100%. Family member states pt has hx of asthma.

## 2022-02-28 NOTE — ED Triage Notes (Signed)
Pt with abd pain for past 3 days ago since discharge.  Cough  Pt recently admitted for Covid.

## 2022-02-28 NOTE — ED Provider Notes (Signed)
[facility] EMERGENCY DEPARTMENT Provider Note   CSN: [id number] Arrival date & time: 02/28/22  1426     History  Chief Complaint  Patient presents with   Abdominal Pain   Shortness of Breath    Dennis Lane is a 61 y.o. male.  Dennis Lane is a 61 y.o. male with a history of COPD and recent admission for COVID, who presents to the emergency department for evaluation of abdominal pain and shortness of breath.  Patient was discharged from the hospital on 5/22 after an admission for near syncope, cough and dehydration in the setting of COVID infection.  Patient reports he was having some abdominal pain during this hospitalization and his continued while he has been home.  He reports he has had some persistent nausea and decreased appetite and reports an intermittent aching generalized abdominal pain.  He reports this tends to be worse when he lays down to sleep at night.  Currently he denies abdominal pain.  He also reports that he has been having frequent issues with shortness of breath.  This occasionally occurs at rest but is usually worse with exertion.  He reports he feels like his asthma is flaring up more than usual.  It is usually improved with albuterol inhaler but he has been having to use this more frequently than usual.  He is continuing to have some cough and coughing up phlegm.  No fevers or chills.  He also just reports feeling generally weak.  The history is provided by the patient.      Home Medications Prior to Admission medications   Medication Sig Start Date End Date Taking? Authorizing Provider  ADVAIR HFA [id number] MCG/ACT inhaler Inhale 2 puffs into the lungs 2 (two) times daily. 09/10/21   [provider]  ascorbic acid (VITAMIN C) 500 MG tablet Take 1 tablet (500 mg total) by mouth daily. 02/22/22 03/24/22  Pokhrel, Corrie Mckusick, MD  Aspirin-Acetaminophen (GOODYS BODY PAIN PO) Take 1 packet by mouth daily as needed (pain,headache.).    [provider]   fluticasone (FLONASE) 50 MCG/ACT nasal spray Place 2 sprays into both nostrils daily.  07/18/17   [provider]  guaiFENesin (MUCINEX) 600 MG 12 hr tablet Take 1 tablet (600 mg total) by mouth 2 (two) times daily for 10 days. 02/21/22 03/03/22  Pokhrel, Corrie Mckusick, MD  PROAIR HFA 108 (90 Base) MCG/ACT inhaler Inhale 1-2 puffs into the lungs every 6 (six) hours as needed for wheezing or shortness of breath.  07/26/17   [provider]  zinc sulfate 220 (50 Zn) MG capsule Take 1 capsule (220 mg total) by mouth daily. 02/22/22 03/24/22  Flora Lipps, MD      Allergies    Patient has no known allergies.    Review of Systems   Review of Systems  Constitutional:  Positive for fatigue. Negative for chills and fever.  HENT: Negative.    Respiratory:  Positive for cough, shortness of breath and wheezing.   Cardiovascular:  Negative for chest pain and leg swelling.  Gastrointestinal:  Positive for abdominal pain and nausea. Negative for diarrhea and vomiting.  Neurological:  Positive for weakness (Generalized). Negative for syncope.  All other systems reviewed and are negative.  Physical Exam Updated Vital Signs BP (!) 158/98   Pulse 75   Temp 98 F (36.7 C) (Oral)   Resp (!) 24   Wt 71.4 kg   SpO2 100%   BMI 23.95 kg/m  Physical Exam Vitals and nursing note reviewed.  Constitutional:      General: He is not in acute distress.    Appearance: Normal appearance. He is well-developed. He is not diaphoretic.  HENT:     Head: Normocephalic and atraumatic.  Eyes:     General:        Right eye: No discharge.        Left eye: No discharge.     Pupils: Pupils are equal, round, and reactive to light.  Cardiovascular:     Rate and Rhythm: Normal rate and regular rhythm.     Pulses: Normal pulses.     Heart sounds: Normal heart sounds.  Pulmonary:     Effort: Pulmonary effort is normal. No respiratory distress.     Breath sounds: Normal breath sounds. No wheezing or rales.      Comments: Respirations equal and unlabored, patient able to speak in full sentences, lungs clear to auscultation bilaterally  Abdominal:     General: Bowel sounds are normal. There is no distension.     Palpations: Abdomen is soft. There is no mass.     Tenderness: There is no abdominal tenderness. There is no guarding.     Comments: Abdomen soft, nondistended, nontender to palpation in all quadrants without guarding or peritoneal signs  Musculoskeletal:        General: No deformity.     Cervical back: Neck supple.  Skin:    General: Skin is warm and dry.     Capillary Refill: Capillary refill takes less than 2 seconds.  Neurological:     Mental Status: He is alert and oriented to person, place, and time.     Coordination: Coordination normal.     Comments: Speech is clear, able to follow commands Moves extremities without ataxia, coordination intact  Psychiatric:        Mood and Affect: Mood normal.        Behavior: Behavior normal.    ED Results / Procedures / Treatments   Labs (all labs ordered are listed, but only abnormal results are displayed) Labs Reviewed  COMPREHENSIVE METABOLIC PANEL - Abnormal; Notable for the following components:      Result Value   BUN 7 (*)    Total Protein 8.3 (*)    AST 13 (*)    Total Bilirubin 0.1 (*)    All other components within normal limits  CBC - Abnormal; Notable for the following components:   Hemoglobin 12.7 (*)    MCV 73.0 (*)    MCH 23.0 (*)    All other components within normal limits  LIPASE, BLOOD  URINALYSIS, ROUTINE W REFLEX MICROSCOPIC    EKG None  Radiology DG Chest 2 View  Result Date: 02/28/2022 CLINICAL DATA:  Abdominal pain for 3 days, cough, history of COPD, recent COVID diagnosis EXAM: CHEST - 2 VIEW COMPARISON:  02/19/2022 FINDINGS: Frontal and lateral views of the chest demonstrate a stable cardiac silhouette. No acute airspace disease, effusion, or pneumothorax. Chronic scarring most pronounced in the  left midlung zone. No acute bony abnormalities. IMPRESSION: 1. Stable chest, no acute process. Electronically Signed   By: Randa Ngo M.D.   On: 02/28/2022 18:19    Procedures Procedures    Medications Ordered in ED Medications  acetaminophen (TYLENOL) tablet 1,000 mg (1,000 mg Oral Given 02/28/22 1756)  ondansetron (ZOFRAN) injection 4 mg (4 mg Intravenous Given 02/28/22 1751)  sodium chloride 0.9 % bolus 500 mL (500 mLs Intravenous Bolus 02/28/22 1754)  methylPREDNISolone sodium succinate (SOLU-MEDROL) 125 mg/2  mL injection 125 mg (125 mg Intravenous Given 02/28/22 1754)  ipratropium-albuterol (DUONEB) 0.5-2.5 (3) MG/3ML nebulizer solution 3 mL (3 mLs Nebulization Given 02/28/22 1845)    ED Course/ Medical Decision Making/ A&P                           Medical Decision Making Amount and/or Complexity of Data Reviewed Labs: ordered. Radiology: ordered.  Risk OTC drugs. Prescription drug management.   Dennis Lane is a 61 y.o. male presents to the ED for concern of shortness of breath and abdominal pain, this involves an extensive number of treatment options, and is a complaint that carries with it a high risk of complications and morbidity.  The differential diagnosis includes long COVID, asthma exacerbation, PE, gastritis, peptic ulcer disease, colitis, bowel obstruction.   Additional history obtained:  Additional history obtained from wife and children at bedside External records from outside source obtained and reviewed including recent labs, chest x-ray and notes from hospitalization on 5/20 - 5/22   Lab Tests:  I Ordered, reviewed, and interpreted labs.  The pertinent results include: No leukocytosis, stable hemoglobin, no significant electrolyte derangements, normal renal and liver function, normal lipase, UA without signs of infection   Imaging Studies ordered:  I ordered imaging studies including chest x-ray I independently visualized and interpreted imaging  which showed no pneumonia or other active cardiopulmonary disease I agree with the radiologist interpretation   Cardiac Monitoring:  The patient was maintained on a cardiac monitor.  I personally viewed and interpreted the cardiac monitored which showed an underlying rhythm of: Normal sinus rhythm   Medicines ordered and prescription drug management:  I ordered medication including Zofran for nausea, Tylenol for pain, DuoNeb, Solu-Medrol for shortness of breath and IV fluid bolus for decreased p.o. intake Reevaluation of the patient after these medicines showed that the patient improved I have reviewed the patients home medicines and have made adjustments as needed   ED Course:  Work-up overall has been reassuring, I suspect a lot of the symptoms may be due to patient's recent COVID infection.  Patient reports some intermittent abdominal pain but he has no abdominal tenderness at all here in the ED, and I do not feel that CT imaging is indicated at this time.  Lungs are clear.  Given patient's reported increase exacerbations of asthma he was given steroids and a breathing treatment.  Patient without tachycardia or chest pain and given his symptoms seem to be sporadic I have lower suspicion for PE. Patient ambulated in the department and maintained normal O2 sats without significantly increased work of breathing and is tolerating p.o. fluids. Discussed with patient and family at bedside that symptoms may be due to recent COVID infection.  Recommended continued supportive treatment, and close PCP follow up Return precautions provided  At this time there does not appear to be any evidence of an acute emergency medical condition requiring further emergent evaluation and the patient appears stable for discharge with appropriate outpatient follow up. Diagnosis and return precautions discussed with patient who verbalizes understanding and is agreeable to discharge.          Final Clinical  Impression(s) / ED Diagnoses Final diagnoses:  Shortness of breath  Generalized abdominal pain  Nausea    Rx / DC Orders ED Discharge Orders          Ordered    predniSONE (DELTASONE) 20 MG tablet  Daily  02/28/22 1914    ondansetron (ZOFRAN) 4 MG tablet  Every 6 hours        02/28/22 1914              Jacqlyn Larsen, PA-C 02/28/22 1920    Lorelle Gibbs, DO 02/28/22 2335

## 2022-03-06 ENCOUNTER — Emergency Department (HOSPITAL_COMMUNITY)
Admission: EM | Admit: 2022-03-06 | Discharge: 2022-03-06 | Disposition: A | Discharge status: Home / Self Care | Payer: BC Managed Care – PPO | Attending: Emergency Medicine | Admitting: Emergency Medicine

## 2022-03-06 ENCOUNTER — Encounter (HOSPITAL_COMMUNITY): Payer: Self-pay | Admitting: Emergency Medicine

## 2022-03-06 ENCOUNTER — Emergency Department (HOSPITAL_COMMUNITY): Payer: BC Managed Care – PPO

## 2022-03-06 ENCOUNTER — Other Ambulatory Visit: Payer: Self-pay

## 2022-03-06 DIAGNOSIS — R1084 Generalized abdominal pain: Secondary | ICD-10-CM | POA: Diagnosis not present

## 2022-03-06 DIAGNOSIS — R109 Unspecified abdominal pain: Secondary | ICD-10-CM | POA: Diagnosis present

## 2022-03-06 DIAGNOSIS — Z87891 Personal history of nicotine dependence: Secondary | ICD-10-CM | POA: Insufficient documentation

## 2022-03-06 LAB — URINALYSIS, ROUTINE W REFLEX MICROSCOPIC
Bilirubin Urine: NEGATIVE
Glucose, UA: NEGATIVE mg/dL
Hgb urine dipstick: NEGATIVE
Ketones, ur: NEGATIVE mg/dL
Leukocytes,Ua: NEGATIVE
Nitrite: NEGATIVE
Protein, ur: NEGATIVE mg/dL
Specific Gravity, Urine: 1.012 (ref 1.005–1.030)
pH: 5 (ref 5.0–8.0)

## 2022-03-06 LAB — COMPREHENSIVE METABOLIC PANEL
ALT: 17 U/L (ref 0–44)
AST: 13 U/L — ABNORMAL LOW (ref 15–41)
Albumin: 3.7 g/dL (ref 3.5–5.0)
Alkaline Phosphatase: 43 U/L (ref 38–126)
Anion gap: 8 (ref 5–15)
BUN: 9 mg/dL (ref 8–23)
CO2: 22 mmol/L (ref 22–32)
Calcium: 8.3 mg/dL — ABNORMAL LOW (ref 8.9–10.3)
Chloride: 104 mmol/L (ref 98–111)
Creatinine, Ser: 1 mg/dL (ref 0.61–1.24)
GFR, Estimated: >60 mL/min (ref >60)
Glucose, Bld: 90 mg/dL (ref 70–99)
Potassium: 3.7 mmol/L (ref 3.5–5.1)
Sodium: 134 mmol/L — ABNORMAL LOW (ref 135–145)
Total Bilirubin: 0.6 mg/dL (ref 0.3–1.2)
Total Protein: 6.9 g/dL (ref 6.5–8.1)

## 2022-03-06 LAB — CBC
HCT: 41.3 % (ref 39.0–52.0)
Hemoglobin: 12.7 g/dL — ABNORMAL LOW (ref 13.0–17.0)
MCH: 23 pg — ABNORMAL LOW (ref 26.0–34.0)
MCHC: 30.8 g/dL (ref 30.0–36.0)
MCV: 74.7 fL — ABNORMAL LOW (ref 80.0–100.0)
Platelets: 352 10*3/uL (ref 150–400)
RBC: 5.53 MIL/uL (ref 4.22–5.81)
RDW: 14.6 % (ref 11.5–15.5)
WBC: 4.8 10*3/uL (ref 4.0–10.5)
nRBC: 0 % (ref 0.0–0.2)

## 2022-03-06 LAB — LIPASE, BLOOD: Lipase: 26 U/L (ref 11–51)

## 2022-03-06 MED ORDER — SENNOSIDES-DOCUSATE SODIUM 8.6-50 MG PO TABS: 2.0000 | ORAL_TABLET | Freq: Every day | ORAL | 0 refills | Status: AC

## 2022-03-06 MED ORDER — SODIUM CHLORIDE 0.9 % IV BOLUS
1000.0000 mL | Freq: Once | INTRAVENOUS | Status: AC
  Administered 2022-03-06: 1000 mL via INTRAVENOUS

## 2022-03-06 MED ORDER — SENNOSIDES-DOCUSATE SODIUM 8.6-50 MG PO TABS: 2.0000 | ORAL_TABLET | Freq: Every day | ORAL | 0 refills | Status: DC

## 2022-03-06 MED ORDER — IOHEXOL 300 MG/ML  SOLN
100.0000 mL | Freq: Once | INTRAMUSCULAR | Status: AC | PRN
  Administered 2022-03-06: 100 mL via INTRAVENOUS

## 2022-03-06 MED ORDER — IOHEXOL 9 MG/ML PO SOLN
ORAL | Status: DC
Start: 2022-03-06 — End: 2022-03-06
  Filled 2022-03-06: qty 1000

## 2022-03-06 MED ORDER — KETOROLAC TROMETHAMINE 30 MG/ML IJ SOLN
15.0000 mg | Freq: Once | INTRAMUSCULAR | Status: AC
  Administered 2022-03-06: 15 mg via INTRAVENOUS
  Filled 2022-03-06: qty 1

## 2022-03-06 MED ORDER — MORPHINE SULFATE (PF) 4 MG/ML IV SOLN
4.0000 mg | Freq: Once | INTRAVENOUS | Status: AC
  Administered 2022-03-06: 4 mg via INTRAVENOUS
  Filled 2022-03-06: qty 1

## 2022-03-06 NOTE — Discharge Instructions (Signed)
As discussed, your evaluation today has been largely reassuring.  But, it is important that you monitor your condition carefully, and do not hesitate to return to the ED if you develop new, or concerning changes in your condition. ? ?Otherwise, please follow-up with your physician for appropriate ongoing care. ? ?

## 2022-03-06 NOTE — ED Triage Notes (Signed)
C/o L arm pain that radiates from L wrist to neck x 2 weeks.  No arm drift.  Also reports generalized abd pain x 2 weeks with constipation.  Last BM 2 weeks ago.  States he also had COVID 2 weeks ago.  Abd pain and arm pain worse at night.

## 2022-03-06 NOTE — ED Provider Notes (Signed)
[facility] EMERGENCY DEPARTMENT Provider Note   CSN: [id number] Arrival date & time: 03/06/22  1779     History  Chief Complaint  Patient presents with   Abdominal Pain   Arm Pain    Dennis Lane is a 61 y.o. male.  HPI Patient presents with his wife who assists with the history.  Patient has a history notable for admission 2 weeks ago to our affiliated facility following a infection with COVID.  He notes that since discharge he has had persistent fatigue, diffuse pain, focal discomfort in the abdomen with diminished bowel movements and had no bowel movements in approximately 2 weeks according to the patient.  No vomiting, though there is nausea.  He is taking all medication as directed.  He was seen once in the ED following his discharge, has not seen his physician.    Home Medications Prior to Admission medications   Medication Sig Start Date End Date Taking? Authorizing Provider  ADVAIR HFA [phone] MCG/ACT inhaler Inhale 2 puffs into the lungs 2 (two) times daily. 09/10/21   [provider]  ascorbic acid (VITAMIN C) 500 MG tablet Take 1 tablet (500 mg total) by mouth daily. 02/22/22 03/24/22  Pokhrel, Corrie Mckusick, MD  Aspirin-Acetaminophen (GOODYS BODY PAIN PO) Take 1 packet by mouth daily as needed (pain,headache.).    [provider]  fluticasone (FLONASE) 50 MCG/ACT nasal spray Place 2 sprays into both nostrils daily.  07/18/17   [provider]  ondansetron (ZOFRAN) 4 MG tablet Take 1 tablet (4 mg total) by mouth every 6 (six) hours. 02/28/22   Jacqlyn Larsen, PA-C  PROAIR HFA [phone] Base) MCG/ACT inhaler Inhale 1-2 puffs into the lungs every 6 (six) hours as needed for wheezing or shortness of breath.  07/26/17   [provider]  senna-docusate (SENOKOT-S) 8.6-50 MG tablet Take 2 tablets by mouth daily for 10 days. 03/06/22 03/16/22  Carmin Muskrat, MD  zinc sulfate 220 (50 Zn) MG capsule Take 1 capsule (220 mg total) by mouth  daily. 02/22/22 03/24/22  Flora Lipps, MD      Allergies    Patient has no known allergies.    Review of Systems   Review of Systems  All other systems reviewed and are negative.  Physical Exam Updated Vital Signs BP (!) 138/93   Pulse 62   Temp 98.4 F (36.9 C) (Oral)   Resp 16   Ht '5\' 8"'$  (1.727 m)   Wt 71.2 kg   SpO2 100%   BMI 23.87 kg/m  Physical Exam Vitals and nursing note reviewed.  Constitutional:      General: He is not in acute distress.    Appearance: He is well-developed.  HENT:     Head:     Comments: Microcephalic, atraumatic Eyes:     Conjunctiva/sclera: Conjunctivae normal.  Cardiovascular:     Rate and Rhythm: Normal rate and regular rhythm.  Pulmonary:     Effort: Pulmonary effort is normal. No respiratory distress.     Breath sounds: No stridor.  Abdominal:     General: There is no distension.     Tenderness: There is generalized abdominal tenderness.  Skin:    General: Skin is warm and dry.  Neurological:     Mental Status: He is alert and oriented to person, place, and time.    ED Results / Procedures / Treatments   Labs (all labs ordered are listed, but only abnormal results are displayed) Labs Reviewed  COMPREHENSIVE  METABOLIC PANEL - Abnormal; Notable for the following components:      Result Value   Sodium 134 (*)    Calcium 8.3 (*)    AST 13 (*)    All other components within normal limits  CBC - Abnormal; Notable for the following components:   Hemoglobin 12.7 (*)    MCV 74.7 (*)    MCH 23.0 (*)    All other components within normal limits  LIPASE, BLOOD  URINALYSIS, ROUTINE W REFLEX MICROSCOPIC    EKG EKG Interpretation  Date/Time:  Sunday March 06 2022 15:40:37 EDT Ventricular Rate:  62 PR Interval:  195 QRS Duration: 89 QT Interval:  425 QTC Calculation: 432 R Axis:   49 Text Interpretation: Sinus rhythm unremarkable ecg Confirmed by Carmin Muskrat [phone]) on 03/06/2022 3:44:04 PM  Radiology CT Abdomen Pelvis  W Contrast  Result Date: 03/06/2022 CLINICAL DATA:  Acute abdominal pain, concern for bowel obstruction EXAM: CT ABDOMEN AND PELVIS WITH CONTRAST TECHNIQUE: Multidetector CT imaging of the abdomen and pelvis was performed using the standard protocol following bolus administration of intravenous contrast. RADIATION DOSE REDUCTION: This exam was performed according to the departmental dose-optimization program which includes automated exposure control, adjustment of the mA and/or kV according to patient size and/or use of iterative reconstruction technique. CONTRAST:  147m OMNIPAQUE IOHEXOL 300 MG/ML  SOLN COMPARISON:  None Available. FINDINGS: Lower chest: No acute abnormality. Hepatobiliary: No focal liver abnormality is seen. No gallstones, gallbladder wall thickening, or biliary dilatation. Pancreas: Unremarkable. No pancreatic ductal dilatation or surrounding inflammatory changes. Spleen: Normal in size without focal abnormality. Adrenals/Urinary Tract: Normal adrenal glands. No renal obstruction or hydronephrosis. Hypodense exophytic left kidney lower pole cyst measures 3 cm. No further imaging follow-up recommended. No hydroureter or ureteral calculus. Urinary bladder unremarkable. Stomach/Bowel: Stomach is within normal limits. Appendix appears normal. No evidence of bowel wall thickening, distention, or inflammatory changes. Vascular/Lymphatic: No significant vascular findings are present. No enlarged abdominal or pelvic lymph nodes. Reproductive: No significant finding by CT Other: Tiny fat containing umbilical hernia noted. Otherwise abdominal wall intact. No ascites or free fluid. Musculoskeletal: Degenerative changes of the lumbar spine noted. No acute osseous finding. IMPRESSION: No acute intra-abdominal or pelvic finding by CT. Negative for bowel obstruction, significant dilatation, or ileus. Electronically Signed   By: MJerilynn Mages  Shick M.D.   On: 03/06/2022 15:07    Procedures Procedures     Medications Ordered in ED Medications  iohexol (OMNIPAQUE) 9 MG/ML oral solution (has no administration in time range)  morphine (PF) 4 MG/ML injection 4 mg (has no administration in time range)  sodium chloride 0.9 % bolus 1,000 mL (1,000 mLs Intravenous New Bag/Given 03/06/22 1218)  ketorolac (TORADOL) 30 MG/ML injection 15 mg (15 mg Intravenous Given 03/06/22 1215)  iohexol (OMNIPAQUE) 300 MG/ML solution 100 mL (100 mLs Intravenous Contrast Given 03/06/22 1442)    ED Course/ Medical Decision Making/ A&P This patient with a Hx of COVID 2 weeks ago, multiple other medical problems, including smoking in the past presents to the ED for concern of domino pain, lack of bowel movements, this involves an extensive number of treatment options, and is a complaint that carries with it a high risk of complications and morbidity.    The differential diagnosis includes bowel obstruction, sequelae of COVID infection, bacteremia, sepsis, dehydration, electrolyte abnormalities   Social Determinants of Health:  Former smoker  Additional history obtained:  Additional history and/or information obtained from wife, chart review, notable for wife for HPI,  chart review for discharge summary from 2 weeks ago following admission for COVID during which she was found to have mild AKI.   After the initial evaluation, orders, including: Fluids analgesics CT.  Labs ordered from triage.   Patient placed on Cardiac and Pulse-Oximetry Monitors. The patient was maintained on a cardiac monitor.  The cardiac monitored showed an rhythm of 65 SR, nml The patient was also maintained on pulse oximetry. The readings were typically 100% ra, nml   On repeat evaluation of the patient  calm in no distress, hemodynamically unremarkable.  Discussed all findings at length.  Lab Tests:  I personally interpreted labs.  The pertinent results include: Unremarkable labs, no urinary tract infection, pancreas normal  Imaging  Studies ordered:  I independently visualized and interpreted imaging which showed no acute intra-abdominal processes, some suspicion for mild constipation I agree with the radiologist interpretation   Dispostion / Final MDM:  After consideration of the diagnostic results and the patient's response to treatment, patient is appropriate for discharge.  This adult male with several medical problems, recent COVID infection now presents with multiple complaints, seemingly, primarily abdominal pain.  Evaluation here reassuring, no evidence of bacteremia, sepsis, obstruction, diverticulitis.  Some suspicion for the patient's recovery from COVID contributing to his ongoing symptoms given the reassuring findings.  After lengthy conversation with the patient and his wife, he was discharged to follow-up with primary care which is scheduled for tomorrow.  Final Clinical Impression(s) / ED Diagnoses Final diagnoses:  Generalized abdominal pain    Rx / DC Orders ED Discharge Orders          Ordered    senna-docusate (SENOKOT-S) 8.6-50 MG tablet  Daily,   Status:  Discontinued        03/06/22 1542    senna-docusate (SENOKOT-S) 8.6-50 MG tablet  Daily        03/06/22 1543              Carmin Muskrat, MD 03/06/22 1544

## 2022-05-09 ENCOUNTER — Other Ambulatory Visit: Payer: Self-pay

## 2022-05-11 ENCOUNTER — Ambulatory Visit (INDEPENDENT_AMBULATORY_CARE_PROVIDER_SITE_OTHER): Payer: BC Managed Care – PPO | Admitting: Gastroenterology

## 2022-05-11 ENCOUNTER — Other Ambulatory Visit: Payer: Self-pay

## 2022-05-11 ENCOUNTER — Encounter: Payer: Self-pay | Admitting: Gastroenterology

## 2022-05-11 VITALS — BP 131/86 | HR 56 | Temp 98.4°F | Ht 68.0 in | Wt 157.4 lb

## 2022-05-11 DIAGNOSIS — K5900 Constipation, unspecified: Secondary | ICD-10-CM | POA: Insufficient documentation

## 2022-05-11 DIAGNOSIS — J45909 Unspecified asthma, uncomplicated: Secondary | ICD-10-CM | POA: Insufficient documentation

## 2022-05-11 DIAGNOSIS — Z2821 Immunization not carried out because of patient refusal: Secondary | ICD-10-CM | POA: Insufficient documentation

## 2022-05-11 DIAGNOSIS — D509 Iron deficiency anemia, unspecified: Secondary | ICD-10-CM | POA: Diagnosis not present

## 2022-05-11 DIAGNOSIS — H18413 Arcus senilis, bilateral: Secondary | ICD-10-CM | POA: Insufficient documentation

## 2022-05-11 DIAGNOSIS — R42 Dizziness and giddiness: Secondary | ICD-10-CM | POA: Insufficient documentation

## 2022-05-11 DIAGNOSIS — N529 Male erectile dysfunction, unspecified: Secondary | ICD-10-CM | POA: Insufficient documentation

## 2022-05-11 DIAGNOSIS — J309 Allergic rhinitis, unspecified: Secondary | ICD-10-CM | POA: Insufficient documentation

## 2022-05-11 MED ORDER — NA SULFATE-K SULFATE-MG SULF 17.5-3.13-1.6 GM/177ML PO SOLN: 354.0000 mL | Freq: Once | ORAL | 0 refills | Status: AC

## 2022-05-11 NOTE — Progress Notes (Signed)
Dennis Darby, MD [address]  [place]  [place], [place] [address]  Main: [phone]  Fax: [phone]    Gastroenterology Consultation  Referring Provider:     Avelino Leeds* Primary Care Physician:  [facility] [place] [facility] Gastroenterologist:  Dr. Cephas Lane Reason for Consultation:     Microcytic anemia, fatigue        HPI:   Dennis Lane is a 61 y.o. male referred by Dr. Thera Flake [facility]  for consultation & management of microcytic anemia.  Patient is originally diagnosed with anemia in May 2023, his hemoglobin was 11.3, he has history of chronic microcytosis, MCV 73.  Patient had COVID infection in May 2023 and was found to have anemia.  Patient reports that since then, he has been feeling dizzy, fatigue, being tired at work.  He is a Designer, industrial/product, currently stopped working due to dizziness.  His PCP referred to GI for further evaluation of anemia.  Patient also reports abdominal discomfort associated with abdominal bloating and irregular bowel habits.  Patient denies any weight loss, loss of appetite.  He is accompanied by his wife today.  He denies any rectal bleeding, black stools.  CT abdomen pelvis with contrast in June 2023 did not reveal any acute intra-abdominal pathology.  Patient denies any intake of BC powder, Goody powder.  He stopped taking 2 years ago.  Patient does not smoke or drink alcohol He denies any IV drug abuse  NSAIDs: None  Antiplts/Anticoagulants/Anti thrombotics: None  GI Procedures: None  Past Medical History:  Diagnosis Date   COPD (chronic obstructive pulmonary disease) (HCC)    COVID    Seasonal allergies     Past Surgical History:  Procedure Laterality Date   EYE SURGERY       Current Outpatient Medications:    acetaminophen (TYLENOL) 500 MG tablet, Take by mouth., Disp: , Rfl:    ADVAIR HFA 45-21 MCG/ACT inhaler, Inhale 2 puffs into the lungs 2 (two)  times daily., Disp: , Rfl:    fluticasone (FLONASE) 50 MCG/ACT nasal spray, 1 spray into each nostril daily., Disp: , Rfl:    Na Sulfate-K Sulfate-Mg Sulf 17.5-3.13-1.6 GM/177ML SOLN, Take 354 mLs by mouth once for 1 dose., Disp: 354 mL, Rfl: 0   ondansetron (ZOFRAN) 4 MG tablet, Take by mouth., Disp: , Rfl:    polyethylene glycol (MIRALAX / GLYCOLAX) 17 g packet, 1 packet mixed with 8 ounces of fluid, Disp: , Rfl:    PROAIR HFA 108 (90 Base) MCG/ACT inhaler, Inhale 1-2 puffs into the lungs every 6 (six) hours as needed for wheezing or shortness of breath. , Disp: , Rfl: 0   Aspirin-Acetaminophen (GOODYS BODY PAIN PO), Take 1 packet by mouth daily as needed (pain,headache.). (Patient not taking: Reported on 05/11/2022), Disp: , Rfl:    No family history on file.   Social History   Tobacco Use   Smoking status: Never   Smokeless tobacco: Never  Vaping Use   Vaping Use: Never used  Substance Use Topics   Alcohol use: No   Drug use: No    Allergies as of 05/11/2022   (No Known Allergies)    Review of Systems:    All systems reviewed and negative except where noted in HPI.   Physical Exam:  BP 131/86 (BP Location: Left Arm, Patient Position: Sitting, Cuff Size: Normal)   Pulse (!) 56   Temp 98.4 F (36.9 C) (  Oral)   Ht '5\' 8"'$  (1.727 m)   Wt 157 lb 6 oz (71.4 kg)   BMI 23.93 kg/m  No LMP for male patient.  General:   Alert,  Well-developed, well-nourished, pleasant and cooperative in NAD Head:  Normocephalic and atraumatic. Eyes:  Sclera clear, no icterus.   Conjunctiva pink. Ears:  Normal auditory acuity. Nose:  No deformity, discharge, or lesions. Mouth:  No deformity or lesions,oropharynx pink & moist. Neck:  Supple; no masses or thyromegaly. Lungs:  Respirations even and unlabored.  Clear throughout to auscultation.   No wheezes, crackles, or rhonchi. No acute distress. Heart:  Regular rate and rhythm; no murmurs, clicks, rubs, or gallops. Abdomen:  Normal bowel sounds.  Soft, non-tender and moderately distended, tympanic without masses, hepatosplenomegaly or hernias noted.  No guarding or rebound tenderness.   Rectal: Not performed Msk:  Symmetrical without gross deformities. Good, equal movement & strength bilaterally. Pulses:  Normal pulses noted. Extremities:  No clubbing or edema.  No cyanosis. Neurologic:  Alert and oriented x3;  grossly normal neurologically. Skin:  Intact without significant lesions or rashes. No jaundice. Psych:  Alert and cooperative. Normal mood and affect.  Imaging Studies: Reviewed  Assessment and Plan:   Dennis Lane is a 61 y.o. pleasant African-American male with no significant past medical history is seen in consultation for chronic symptoms of microcytic anemia, abdominal discomfort and bloating, history of COVID infection in May 2023.  Imaging did not reveal any intra-abdominal pathology  Recheck CBC, iron panel, B12 and folate levels Trial of fusion plus, samples provided Advised patient to take MiraLAX 17 g daily Recommend EGD and colonoscopy with possible TI evaluation   Follow up based on the above workup   Dennis Darby, MD

## 2022-05-16 ENCOUNTER — Encounter: Payer: Self-pay | Admitting: Gastroenterology

## 2022-05-24 ENCOUNTER — Encounter: Payer: Self-pay | Admitting: Gastroenterology

## 2022-06-03 ENCOUNTER — Encounter: Payer: Self-pay | Admitting: Gastroenterology

## 2022-06-07 ENCOUNTER — Encounter: Payer: Self-pay | Admitting: Gastroenterology

## 2022-06-07 ENCOUNTER — Encounter
Admission: RE | Disposition: A | Discharge status: Home / Self Care | Payer: Self-pay | Source: Home / Self Care | Attending: Gastroenterology

## 2022-06-07 ENCOUNTER — Ambulatory Visit
Admission: RE | Admit: 2022-06-07 | Discharge: 2022-06-07 | Disposition: A | Discharge status: Home / Self Care | Payer: BC Managed Care – PPO | Attending: Gastroenterology | Admitting: Gastroenterology

## 2022-06-07 ENCOUNTER — Ambulatory Visit: Payer: BC Managed Care – PPO | Admitting: Anesthesiology

## 2022-06-07 DIAGNOSIS — D509 Iron deficiency anemia, unspecified: Secondary | ICD-10-CM | POA: Diagnosis not present

## 2022-06-07 DIAGNOSIS — R1013 Epigastric pain: Secondary | ICD-10-CM | POA: Diagnosis not present

## 2022-06-07 DIAGNOSIS — B9681 Helicobacter pylori [H. pylori] as the cause of diseases classified elsewhere: Secondary | ICD-10-CM | POA: Insufficient documentation

## 2022-06-07 DIAGNOSIS — K2950 Unspecified chronic gastritis without bleeding: Secondary | ICD-10-CM | POA: Insufficient documentation

## 2022-06-07 DIAGNOSIS — J449 Chronic obstructive pulmonary disease, unspecified: Secondary | ICD-10-CM | POA: Insufficient documentation

## 2022-06-07 DIAGNOSIS — N289 Disorder of kidney and ureter, unspecified: Secondary | ICD-10-CM | POA: Diagnosis not present

## 2022-06-07 DIAGNOSIS — R140 Abdominal distension (gaseous): Secondary | ICD-10-CM | POA: Insufficient documentation

## 2022-06-07 HISTORY — PX: ESOPHAGOGASTRODUODENOSCOPY (EGD) WITH PROPOFOL: SHX5813

## 2022-06-07 HISTORY — PX: COLONOSCOPY WITH PROPOFOL: SHX5780

## 2022-06-07 SURGERY — COLONOSCOPY WITH PROPOFOL
Anesthesia: General

## 2022-06-07 MED ORDER — PROPOFOL 500 MG/50ML IV EMUL
INTRAVENOUS | Status: DC | PRN
  Administered 2022-06-07: 145 ug/kg/min via INTRAVENOUS

## 2022-06-07 MED ORDER — GLYCOPYRROLATE 0.2 MG/ML IJ SOLN
INTRAMUSCULAR | Status: DC | PRN
  Administered 2022-06-07: .2 mg via INTRAVENOUS

## 2022-06-07 MED ORDER — SODIUM CHLORIDE 0.9 % IV SOLN
INTRAVENOUS | Status: DC
  Administered 2022-06-07: 1000 mL via INTRAVENOUS

## 2022-06-07 MED ORDER — PROPOFOL 10 MG/ML IV BOLUS
INTRAVENOUS | Status: DC | PRN
  Administered 2022-06-07: 40 mg via INTRAVENOUS
  Administered 2022-06-07: 60 mg via INTRAVENOUS

## 2022-06-07 MED ORDER — STERILE WATER FOR IRRIGATION IR SOLN
Status: DC | PRN
  Administered 2022-06-07 (×2): 60 mL

## 2022-06-07 MED ORDER — LIDOCAINE HCL (CARDIAC) PF 100 MG/5ML IV SOSY
PREFILLED_SYRINGE | INTRAVENOUS | Status: DC | PRN
  Administered 2022-06-07: 100 mg via INTRAVENOUS

## 2022-06-07 NOTE — Anesthesia Postprocedure Evaluation (Signed)
Anesthesia Post Note  Patient: Dennis Lane  Procedure(s) Performed: COLONOSCOPY WITH PROPOFOL ESOPHAGOGASTRODUODENOSCOPY (EGD) WITH PROPOFOL  Patient location during evaluation: PACU Anesthesia Type: General Level of consciousness: awake and alert Pain management: pain level controlled Vital Signs Assessment: post-procedure vital signs reviewed and stable Respiratory status: spontaneous breathing, nonlabored ventilation, respiratory function stable and patient connected to nasal cannula oxygen Cardiovascular status: blood pressure returned to baseline and stable Postop Assessment: no apparent nausea or vomiting Anesthetic complications: no   No notable events documented.   Last Vitals:  Vitals:   06/07/22 0929 06/07/22 0939  BP: (!) 142/94 (!) 142/93  Pulse: 72 (!) 56  Resp: 20 18  Temp:    SpO2: 99% 100%    Last Pain:  Vitals:   06/07/22 0939  TempSrc:   PainSc: 0-No pain                 Molli Barrows

## 2022-06-07 NOTE — Op Note (Signed)
[facility] Gastroenterology Patient Name: Dennis Lane Procedure Date: 06/07/2022 8:26 AM MRN: [id number] Account #: [id number] Date of Birth: [birth year] Admit Type: Outpatient Age: 61 Room: [facility] ENDO ROOM 3 Gender: Male Note Status: Finalized Instrument Name: Park Meo [id number] Procedure:             Colonoscopy Indications:           Unexplained iron deficiency anemia Providers:             Lin Landsman MD, MD Referring MD:          [facility] Medicines:             General Anesthesia Complications:         No immediate complications. Estimated blood loss: None. Procedure:             Pre-Anesthesia Assessment:                        - Prior to the procedure, a History and Physical was                         performed, and patient medications and allergies were                         reviewed. The patient is competent. The risks and                         benefits of the procedure and the sedation options and                         risks were discussed with the patient. All questions                         were answered and informed consent was obtained.                         Patient identification and proposed procedure were                         verified by the physician, the nurse, the                         anesthesiologist, the anesthetist and the technician                         in the pre-procedure area in the procedure room in the                         endoscopy suite. Mental Status Examination: alert and                         oriented. Airway Examination: normal oropharyngeal                         airway and neck mobility. Respiratory Examination:                         clear to auscultation. CV Examination: normal.  Prophylactic Antibiotics: The patient does not require                         prophylactic antibiotics. Prior Anticoagulants: The                         patient has taken  no previous anticoagulant or                         antiplatelet agents. ASA Grade Assessment: III - A                         patient with severe systemic disease. After reviewing                         the risks and benefits, the patient was deemed in                         satisfactory condition to undergo the procedure. The                         anesthesia plan was to use general anesthesia.                         Immediately prior to administration of medications,                         the patient was re-assessed for adequacy to receive                         sedatives. The heart rate, respiratory rate, oxygen                         saturations, blood pressure, adequacy of pulmonary                         ventilation, and response to care were monitored                         throughout the procedure. The physical status of the                         patient was re-assessed after the procedure.                        After obtaining informed consent, the colonoscope was                         passed under direct vision. Throughout the procedure,                         the patient's blood pressure, pulse, and oxygen                         saturations were monitored continuously. The                         Colonoscope was introduced through the anus and  advanced to the 20 cm into the ileum. The colonoscopy                         was performed without difficulty. The patient                         tolerated the procedure well. The quality of the bowel                         preparation was evaluated using the BBPS [facility] Bowel                         Preparation Scale) with scores of: Right Colon = 3,                         Transverse Colon = 3 and Left Colon = 3 (entire mucosa                         seen well with no residual staining, small fragments                         of stool or opaque liquid). The total BBPS score                          equals 9. Findings:      The perianal and digital rectal examinations were normal. Pertinent       negatives include normal sphincter tone and no palpable rectal lesions.      The terminal ileum appeared normal.      The entire examined colon appeared normal.      The retroflexed view of the distal rectum and anal verge was normal and       showed no anal or rectal abnormalities. Impression:            - The examined portion of the ileum was normal.                        - The entire examined colon is normal.                        - The distal rectum and anal verge are normal on                         retroflexion view.                        - No specimens collected. Recommendation:        - Discharge patient to home (with escort).                        - Resume previous diet today.                        - Continue present medications.                        - Repeat colonoscopy in 10 years for screening  purposes. Procedure Code(s):     --- Professional ---                        [phone], Colonoscopy, flexible; diagnostic, including                         collection of specimen(s) by brushing or washing, when                         performed (separate procedure) Diagnosis Code(s):     --- Professional ---                        D50.9, Iron deficiency anemia, unspecified CPT copyright 2019 American Medical Association. All rights reserved. The codes documented in this report are preliminary and upon coder review may  be revised to meet current compliance requirements. Dr. Ulyess Mort Lin Landsman MD, MD 06/07/2022 9:06:42 AM This report has been signed electronically. Number of Addenda: 0 Note Initiated On: 06/07/2022 8:26 AM Scope Withdrawal Time: 0 hours 9 minutes 22 seconds  Total Procedure Duration: 0 hours 10 minutes 49 seconds  Estimated Blood Loss:  Estimated blood loss: none.      [facility]

## 2022-06-07 NOTE — H&P (Signed)
Dennis Darby, MD [address]  [place]  [place], [place] [address]  Main: [phone]  Fax: [phone] Pager: [phone]  Primary Care Physician:  [facility] [place] Primary Gastroenterologist:  Dr. Cephas Lane  Pre-Procedure History & Physical: HPI:  Dennis Lane is a 61 y.o. male is here for an endoscopy and colonoscopy.   Past Medical History:  Diagnosis Date   COPD (chronic obstructive pulmonary disease) ([place])    COVID    Seasonal allergies     Past Surgical History:  Procedure Laterality Date   EYE SURGERY      Prior to Admission medications   Medication Sig Start Date End Date Taking? Authorizing Provider  acetaminophen (TYLENOL) 500 MG tablet Take by mouth.    [provider]  ADVAIR HFA [phone] MCG/ACT inhaler Inhale 2 puffs into the lungs 2 (two) times daily. 09/10/21   [provider]  Aspirin-Acetaminophen (GOODYS BODY PAIN PO) Take 1 packet by mouth daily as needed (pain,headache.). Patient not taking: Reported on 05/11/2022    [provider]  fluticasone (FLONASE) 50 MCG/ACT nasal spray 1 spray into each nostril daily. 07/18/17   [provider]  ondansetron (ZOFRAN) 4 MG tablet Take by mouth. 02/28/22   [provider]  polyethylene glycol (MIRALAX / GLYCOLAX) 17 g packet 1 packet mixed with 8 ounces of fluid 03/13/22   [provider]  PROAIR HFA 108 (90 Base) MCG/ACT inhaler Inhale 1-2 puffs into the lungs every 6 (six) hours as needed for wheezing or shortness of breath.  07/26/17   [provider]    Allergies as of 05/11/2022   (No Known Allergies)    History reviewed. No pertinent family history.  Social History   Socioeconomic History   Marital status: Married    Spouse name: Not on file   Number of children: Not on file   Years of education: Not on file   Highest education level: Not on file  Occupational History   Not on file  Tobacco Use    Smoking status: Never   Smokeless tobacco: Never  Vaping Use   Vaping Use: Never used  Substance and Sexual Activity   Alcohol use: No   Drug use: No   Sexual activity: Not on file  Other Topics Concern   Not on file  Social History Narrative   Not on file   Social Determinants of Health   Financial Resource Strain: Not on file  Food Insecurity: Not on file  Transportation Needs: Not on file  Physical Activity: Not on file  Stress: Not on file  Social Connections: Not on file  Intimate Partner Violence: Not on file    Review of Systems: See HPI, otherwise negative ROS  Physical Exam: BP (!) 130/100   Pulse 64   Temp (!) 97.2 F (36.2 C) (Temporal)   Resp 18   Ht '5\' 7"'$  (1.702 m)   Wt 71.4 kg   SpO2 100%   BMI 24.64 kg/m  General:   Alert,  pleasant and cooperative in NAD Head:  Normocephalic and atraumatic. Neck:  Supple; no masses or thyromegaly. Lungs:  Clear throughout to auscultation.    Heart:  Regular rate and rhythm. Abdomen:  Soft, nontender and nondistended. Normal bowel sounds, without guarding, and without rebound.   Neurologic:  Alert and  oriented x4;  grossly normal neurologically.  Impression/Plan: Dennis Lane is here for an endoscopy and colonoscopy to be performed for microcytic anemia  Risks, benefits, limitations, and alternatives regarding  endoscopy and colonoscopy have been reviewed with the patient.  Questions have been answered.  All parties agreeable.   Sherri Sear, MD  06/07/2022, 8:24 AM

## 2022-06-07 NOTE — Anesthesia Preprocedure Evaluation (Signed)
Anesthesia Evaluation  Patient identified by MRN, date of birth, ID band Patient awake    Reviewed: Allergy & Precautions, H&P , NPO status , Patient's Chart, lab work & pertinent test results, reviewed documented beta blocker date and time   Airway Mallampati: II   Neck ROM: full    Dental  (+) Poor Dentition   Pulmonary asthma , COPD,    Pulmonary exam normal        Cardiovascular Exercise Tolerance: Good negative cardio ROS Normal cardiovascular exam Rhythm:regular Rate:Normal     Neuro/Psych negative neurological ROS  negative psych ROS   GI/Hepatic negative GI ROS, Neg liver ROS,   Endo/Other  negative endocrine ROS  Renal/GU Renal disease  negative genitourinary   Musculoskeletal   Abdominal   Peds  Hematology negative hematology ROS (+)   Anesthesia Other Findings Past Medical History: No date: COPD (chronic obstructive pulmonary disease) (HCC) No date: COVID No date: Seasonal allergies Past Surgical History: No date: EYE SURGERY BMI    Body Mass Index: 24.64 kg/m     Reproductive/Obstetrics negative OB ROS                             Anesthesia Physical Anesthesia Plan  ASA: 3  Anesthesia Plan: General   Post-op Pain Management:    Induction:   PONV Risk Score and Plan:   Airway Management Planned:   Additional Equipment:   Intra-op Plan:   Post-operative Plan:   Informed Consent: I have reviewed the patients History and Physical, chart, labs and discussed the procedure including the risks, benefits and alternatives for the proposed anesthesia with the patient or authorized representative who has indicated his/her understanding and acceptance.     Dental Advisory Given  Plan Discussed with: CRNA  Anesthesia Plan Comments:         Anesthesia Quick Evaluation

## 2022-06-07 NOTE — Anesthesia Procedure Notes (Signed)
Procedure Name: General with mask airway Date/Time: 06/07/2022 8:54 AM  Performed by: Kelton Pillar, CRNAPre-anesthesia Checklist: Patient identified, Suction available, Emergency Drugs available and Patient being monitored Patient Re-evaluated:Patient Re-evaluated prior to induction Oxygen Delivery Method: Simple face mask Induction Type: IV induction Placement Confirmation: positive ETCO2, CO2 detector and breath sounds checked- equal and bilateral Dental Injury: Teeth and Oropharynx as per pre-operative assessment

## 2022-06-07 NOTE — Op Note (Signed)
[facility] Gastroenterology Patient Name: Dennis Lane Procedure Date: 06/07/2022 8:27 AM MRN: [id number] Account #: [id number] Date of Birth: [birth year] Admit Type: Outpatient Age: 61 Room: [facility] ENDO ROOM 3 Gender: Male Note Status: Finalized Instrument Name: Altamese Cabal Endoscope [id number] Procedure:             Upper GI endoscopy Indications:           Epigastric abdominal pain, Unexplained iron deficiency                         anemia, Abdominal bloating Providers:             Lin Landsman MD, MD Referring MD:          [facility] Medicines:             General Anesthesia Complications:         No immediate complications. Estimated blood loss: None. Procedure:             Pre-Anesthesia Assessment:                        - Prior to the procedure, a History and Physical was                         performed, and patient medications and allergies were                         reviewed. The patient is competent. The risks and                         benefits of the procedure and the sedation options and                         risks were discussed with the patient. All questions                         were answered and informed consent was obtained.                         Patient identification and proposed procedure were                         verified by the physician, the nurse, the                         anesthesiologist, the anesthetist and the technician                         in the pre-procedure area in the procedure room in the                         endoscopy suite. Mental Status Examination: alert and                         oriented. Airway Examination: normal oropharyngeal                         airway and neck mobility. Respiratory Examination:  clear to auscultation. CV Examination: normal.                         Prophylactic Antibiotics: The patient does not require                          prophylactic antibiotics. Prior Anticoagulants: The                         patient has taken no previous anticoagulant or                         antiplatelet agents. ASA Grade Assessment: III - A                         patient with severe systemic disease. After reviewing                         the risks and benefits, the patient was deemed in                         satisfactory condition to undergo the procedure. The                         anesthesia plan was to use general anesthesia.                         Immediately prior to administration of medications,                         the patient was re-assessed for adequacy to receive                         sedatives. The heart rate, respiratory rate, oxygen                         saturations, blood pressure, adequacy of pulmonary                         ventilation, and response to care were monitored                         throughout the procedure. The physical status of the                         patient was re-assessed after the procedure.                        After obtaining informed consent, the endoscope was                         passed under direct vision. Throughout the procedure,                         the patient's blood pressure, pulse, and oxygen                         saturations were monitored continuously. The Endoscope  was introduced through the mouth, and advanced to the                         second part of duodenum. The upper GI endoscopy was                         accomplished without difficulty. The patient tolerated                         the procedure well. Findings:      The duodenal bulb and second portion of the duodenum were normal.      The entire examined stomach was normal. Biopsies were taken with a cold       forceps for histology.      The gastroesophageal junction and examined esophagus were normal.      The cardia and gastric fundus were normal on  retroflexion. Impression:            - Normal duodenal bulb and second portion of the                         duodenum.                        - Normal stomach. Biopsied.                        - Normal gastroesophageal junction and esophagus. Recommendation:        - Await pathology results.                        - Proceed with colonoscopy as scheduled                        See colonoscopy report Procedure Code(s):     --- Professional ---                        [phone], Esophagogastroduodenoscopy, flexible,                         transoral; with biopsy, single or multiple Diagnosis Code(s):     --- Professional ---                        R10.13, Epigastric pain                        D50.9, Iron deficiency anemia, unspecified                        R14.0, Abdominal distension (gaseous) CPT copyright 2019 [employer] Medical Association. All rights reserved. The codes documented in this report are preliminary and upon coder review may  be revised to meet current compliance requirements. Dr. Ulyess Mort Lin Landsman MD, MD 06/07/2022 8:51:18 AM This report has been signed electronically. Number of Addenda: 0 Note Initiated On: 06/07/2022 8:27 AM Estimated Blood Loss:  Estimated blood loss: none.      [facility]

## 2022-06-07 NOTE — Transfer of Care (Signed)
Immediate Anesthesia Transfer of Care Note  Patient: Dennis Lane  Procedure(s) Performed: COLONOSCOPY WITH PROPOFOL ESOPHAGOGASTRODUODENOSCOPY (EGD) WITH PROPOFOL  Patient Location: Endoscopy Unit  Anesthesia Type:General  Level of Consciousness: drowsy and patient cooperative  Airway & Oxygen Therapy: Patient Spontanous Breathing and Patient connected to face mask oxygen  Post-op Assessment: Report given to RN and Post -op Vital signs reviewed and stable  Post vital signs: Reviewed and stable  Last Vitals:  Vitals Value Taken Time  BP 132/106 06/07/22 0910  Temp 36.1 C 06/07/22 0909  Pulse 64 06/07/22 0915  Resp 17 06/07/22 0915  SpO2 100 % 06/07/22 0915  Vitals shown include unvalidated device data.  Last Pain:  Vitals:   06/07/22 0909  TempSrc: Tympanic  PainSc: Asleep         Complications: No notable events documented.

## 2022-06-08 ENCOUNTER — Encounter: Payer: Self-pay | Admitting: Gastroenterology

## 2022-06-08 ENCOUNTER — Telehealth: Payer: Self-pay

## 2022-06-08 DIAGNOSIS — A048 Other specified bacterial intestinal infections: Secondary | ICD-10-CM

## 2022-06-08 LAB — SURGICAL PATHOLOGY

## 2022-06-08 MED ORDER — AMOXICILLIN 500 MG PO TABS: 1000.0000 mg | ORAL_TABLET | Freq: Two times a day (BID) | ORAL | 0 refills | Status: AC

## 2022-06-08 MED ORDER — CLARITHROMYCIN 500 MG PO TABS: 500.0000 mg | ORAL_TABLET | Freq: Two times a day (BID) | ORAL | 0 refills | Status: AC

## 2022-06-08 MED ORDER — OMEPRAZOLE 20 MG PO CPDR: 20.0000 mg | DELAYED_RELEASE_CAPSULE | Freq: Two times a day (BID) | ORAL | 0 refills | Status: AC

## 2022-06-08 NOTE — Telephone Encounter (Signed)
Patient verbalized understanding results and he will go pick up the medication. He will repeat labs in 6 weeks order labs and sent medication to the pharmacy

## 2022-06-08 NOTE — Telephone Encounter (Signed)
-----   Message from Lin Landsman, MD sent at 06/08/2022  4:27 PM EDT ----- Please inform patient that the pathology results from upper endoscopy confirm H. pylori gastritis Recommend treatment for it, please send in the prescription for triple therapy to treat H Pylori for 14days  Omeprazole '20mg'$  BID Clarithromycin '500mg'$  BID Amoxicillin 1gm BID  Order H Pylori breath test in 4weeks after completing medication to confirm eradication.  Patient should be off prilosec and H2 blocker atleast for 2weeks before the test  Thanks RV

## 2022-12-16 ENCOUNTER — Other Ambulatory Visit: Payer: Self-pay | Admitting: Physician Assistant

## 2022-12-16 DIAGNOSIS — G4486 Cervicogenic headache: Secondary | ICD-10-CM

## 2022-12-16 DIAGNOSIS — M545 Low back pain, unspecified: Secondary | ICD-10-CM

## 2022-12-16 DIAGNOSIS — Z87898 Personal history of other specified conditions: Secondary | ICD-10-CM

## 2022-12-16 DIAGNOSIS — M542 Cervicalgia: Secondary | ICD-10-CM

## 2022-12-16 DIAGNOSIS — R42 Dizziness and giddiness: Secondary | ICD-10-CM

## 2022-12-30 ENCOUNTER — Encounter: Payer: Self-pay | Admitting: Physician Assistant

## 2023-01-02 ENCOUNTER — Other Ambulatory Visit: Payer: Self-pay

## 2023-07-28 IMAGING — CT CT ABD-PELV W/ CM
2 of 4 series · 16 of 46 positions shown, 18 images · IV contrast (Omni 300)
Comparison: None Available.

CLINICAL DATA: Acute abdominal pain, concern for bowel obstruction

EXAM:
CT ABDOMEN AND PELVIS WITH CONTRAST
TECHNIQUE: Multidetector CT imaging of the abdomen and pelvis was performed
using the standard protocol following bolus administration of
intravenous contrast.

[Series 3: a/p w/ 5mm · axial · 0.78mm/px · z∈[+919,+1359]mm · 13 of 97 slices shown, 15 images]
[im 5/97  soft-tissue]
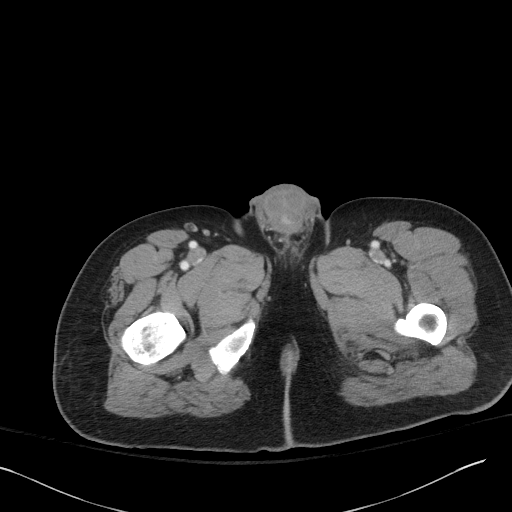
[im 5/97  bone]
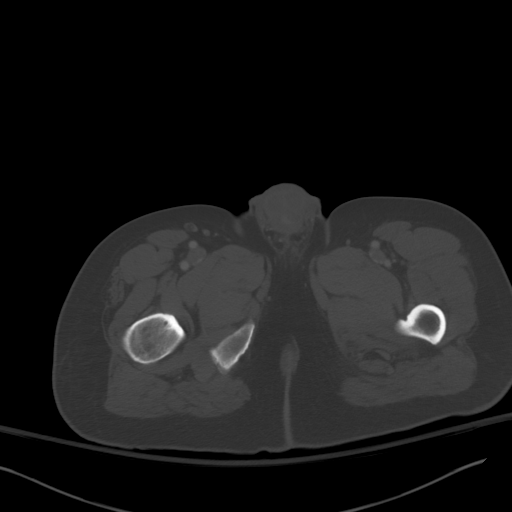
[im 13/97  soft-tissue]
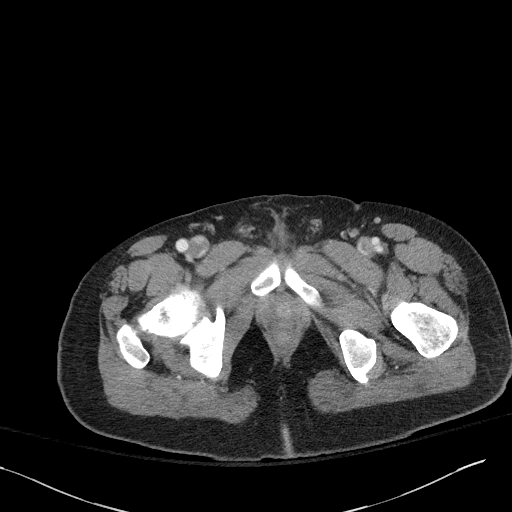
[im 21/97  soft-tissue]
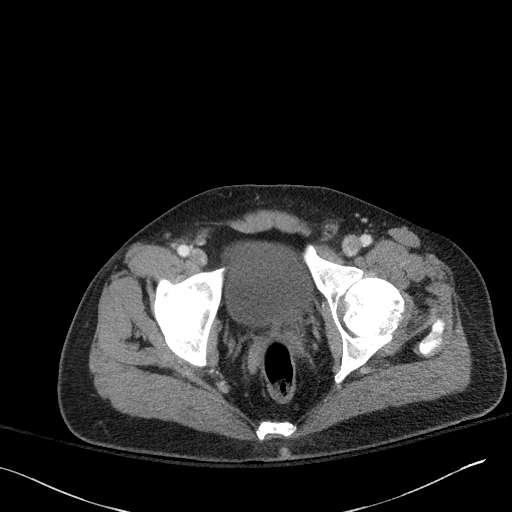
[im 29/97  soft-tissue]
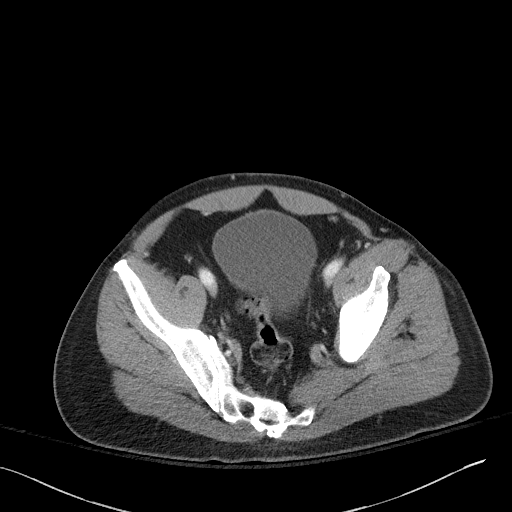
[im 33/97  soft-tissue]
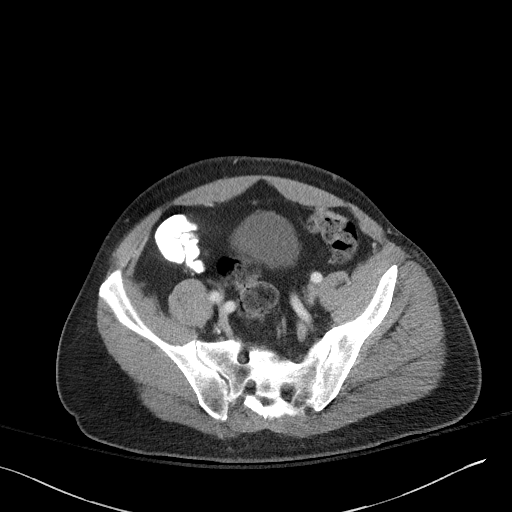
[im 41/97  soft-tissue]
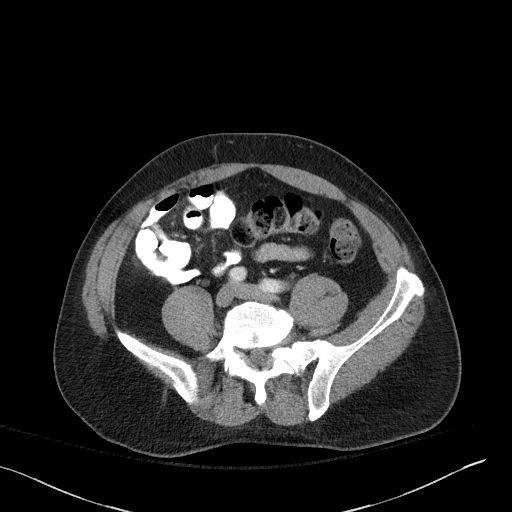
[im 49/97  soft-tissue]
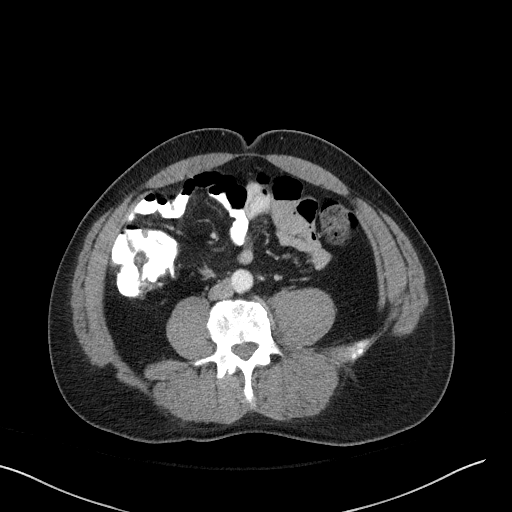
[im 57/97  soft-tissue]
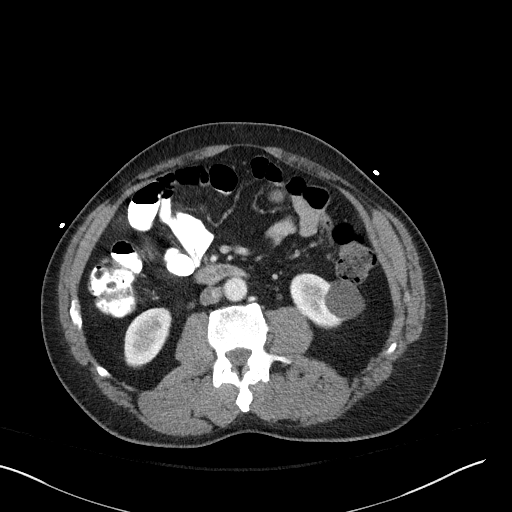
[im 65/97  soft-tissue]
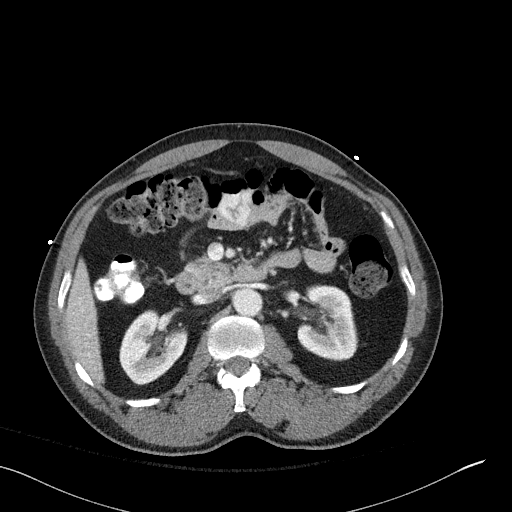
[im 65/97  bone]
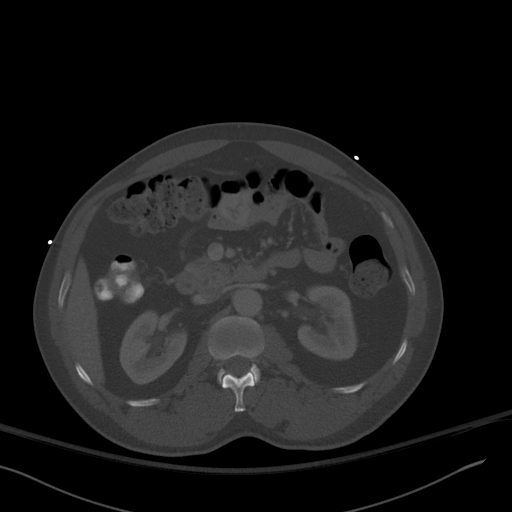
[im 69/97  soft-tissue]
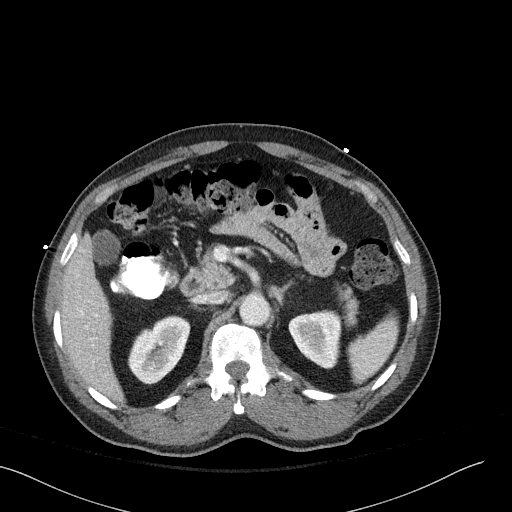
[im 77/97  soft-tissue]
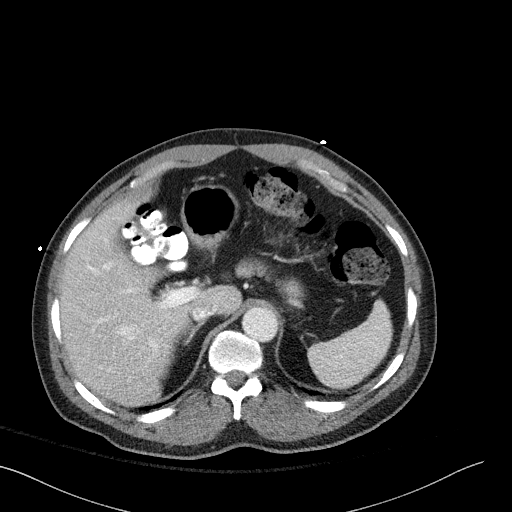
[im 85/97  soft-tissue]
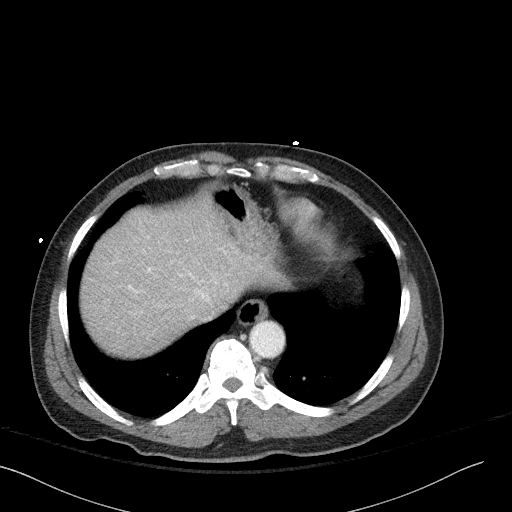
[im 93/97  soft-tissue]
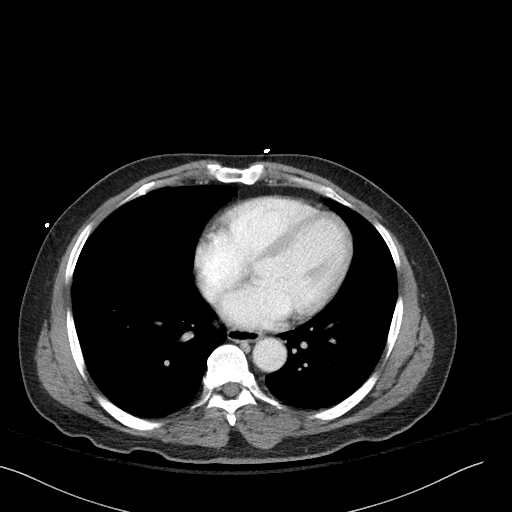

[Series 6: a/p w/ cor · coronal · 0.74mm/px · 3 of 141 slices shown]
[im 47/141  soft-tissue]
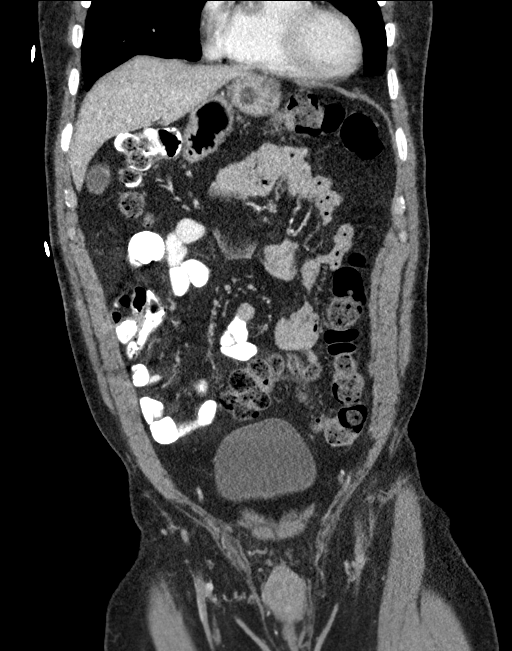
[im 63/141  soft-tissue]
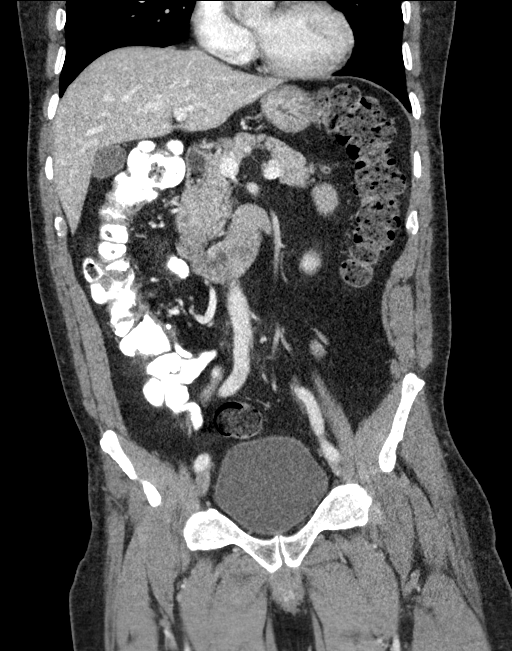
[im 78/141  soft-tissue]
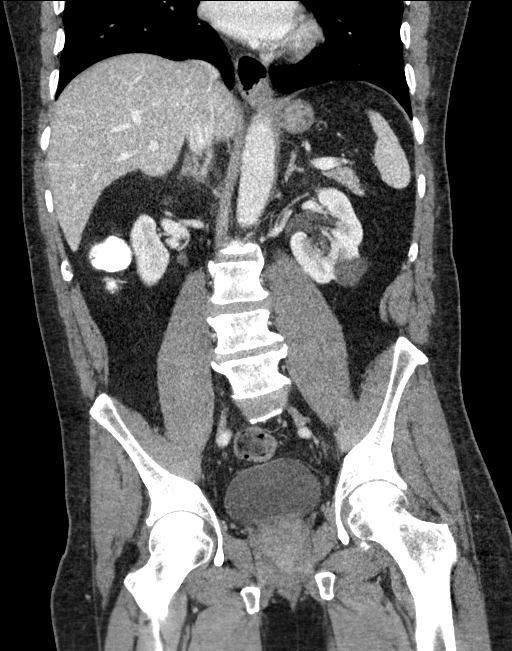

[16 of 46 positions shown; findings below may reference images not displayed]

RADIATION DOSE REDUCTION: This exam was performed according to the
departmental dose-optimization program which includes automated
exposure control, adjustment of the mA and/or kV according to
patient size and/or use of iterative reconstruction technique.

CONTRAST:  100mL OMNIPAQUE IOHEXOL 300 MG/ML  SOLN
FINDINGS: Lower chest: No acute abnormality.

Hepatobiliary: No focal liver abnormality is seen. No gallstones,
gallbladder wall thickening, or biliary dilatation.

Pancreas: Unremarkable. No pancreatic ductal dilatation or
surrounding inflammatory changes.

Spleen: Normal in size without focal abnormality.

Adrenals/Urinary Tract: Normal adrenal glands. No renal obstruction
or hydronephrosis. Hypodense exophytic left kidney lower pole cyst
measures 3 cm. No further imaging follow-up recommended. No
hydroureter or ureteral calculus. Urinary bladder unremarkable.

Stomach/Bowel: Stomach is within normal limits. Appendix appears
normal. No evidence of bowel wall thickening, distention, or
inflammatory changes.

Vascular/Lymphatic: No significant vascular findings are present. No
enlarged abdominal or pelvic lymph nodes.

Reproductive: No significant finding by CT

Other: Tiny fat containing umbilical hernia noted. Otherwise
abdominal wall intact. No ascites or free fluid.

Musculoskeletal: Degenerative changes of the lumbar spine noted. No
acute osseous finding.
IMPRESSION: No acute intra-abdominal or pelvic finding by CT. Negative for bowel
obstruction, significant dilatation, or ileus.

## 2023-08-28 ENCOUNTER — Other Ambulatory Visit: Payer: Self-pay | Admitting: Internal Medicine

## 2023-08-28 DIAGNOSIS — R079 Chest pain, unspecified: Secondary | ICD-10-CM

## 2023-08-28 DIAGNOSIS — R42 Dizziness and giddiness: Secondary | ICD-10-CM

## 2023-09-06 ENCOUNTER — Telehealth (HOSPITAL_COMMUNITY): Payer: Self-pay | Admitting: Emergency Medicine

## 2023-09-06 NOTE — Telephone Encounter (Signed)
Reaching out to patient to offer assistance regarding upcoming cardiac imaging study; pt verbalizes understanding of appt date/time, parking situation and where to check in, pre-test NPO status and medications ordered, and verified current allergies; name and call back number provided for further questions should they arise Rockwell Alexandria RN Navigator Cardiac Imaging [facility] [phone] office [phone] cell  Transportation by bus- has address and appt time Advised not to take ED meds

## 2023-09-07 ENCOUNTER — Ambulatory Visit
Admission: RE | Admit: 2023-09-07 | Discharge: 2023-09-07 | Disposition: A | Discharge status: Home / Self Care | Payer: Medicaid Other | Source: Ambulatory Visit | Attending: Internal Medicine | Admitting: Internal Medicine

## 2023-09-07 DIAGNOSIS — R072 Precordial pain: Secondary | ICD-10-CM

## 2023-09-07 DIAGNOSIS — R079 Chest pain, unspecified: Secondary | ICD-10-CM | POA: Diagnosis present

## 2023-09-07 DIAGNOSIS — R42 Dizziness and giddiness: Secondary | ICD-10-CM | POA: Insufficient documentation

## 2023-09-07 MED ORDER — IOHEXOL 350 MG/ML SOLN
75.0000 mL | Freq: Once | INTRAVENOUS | Status: AC | PRN
  Administered 2023-09-07: 75 mL via INTRAVENOUS

## 2023-09-07 MED ORDER — METOPROLOL TARTRATE 5 MG/5ML IV SOLN
5.0000 mg | Freq: Once | INTRAVENOUS | Status: AC
  Administered 2023-09-07: 5 mg via INTRAVENOUS

## 2023-09-07 MED ORDER — METOPROLOL TARTRATE 5 MG/5ML IV SOLN: 10.0000 mg | Freq: Once | INTRAVENOUS | Status: DC | PRN

## 2023-09-07 MED ORDER — DILTIAZEM HCL 25 MG/5ML IV SOLN: 10.0000 mg | INTRAVENOUS | Status: DC | PRN

## 2023-09-07 MED ORDER — NITROGLYCERIN 0.4 MG SL SUBL
0.8000 mg | SUBLINGUAL_TABLET | Freq: Once | SUBLINGUAL | Status: AC
Start: 2023-09-07 — End: 2023-09-07
  Administered 2023-09-07: 0.8 mg via SUBLINGUAL

## 2023-09-07 MED ORDER — SODIUM CHLORIDE 0.9 % IV BOLUS
125.0000 mL | Freq: Once | INTRAVENOUS | Status: AC
  Administered 2023-09-07: 125 mL via INTRAVENOUS

## 2023-09-07 NOTE — Progress Notes (Signed)
Patient tolerated procedure well. Ambulate w/o difficulty. Denies light headedness or being dizzy. Sitting up drinking water provided. Encouraged to drink extra water today and reasoning explained. Verbalized understanding. All questions answered. ABC intact. No further needs. Discharge from procedure area w/o issues.

## 2023-12-31 ENCOUNTER — Other Ambulatory Visit: Payer: Self-pay

## 2023-12-31 ENCOUNTER — Emergency Department (HOSPITAL_COMMUNITY)
Admission: EM | Admit: 2023-12-31 | Discharge: 2023-12-31 | Disposition: A | Discharge status: Home / Self Care | Attending: Emergency Medicine | Admitting: Emergency Medicine

## 2023-12-31 ENCOUNTER — Encounter (HOSPITAL_COMMUNITY): Payer: Self-pay | Admitting: Emergency Medicine

## 2023-12-31 DIAGNOSIS — L299 Pruritus, unspecified: Secondary | ICD-10-CM | POA: Diagnosis present

## 2023-12-31 LAB — COMPREHENSIVE METABOLIC PANEL WITH GFR
ALT: 15 U/L (ref 0–44)
AST: 14 U/L — ABNORMAL LOW (ref 15–41)
Albumin: 3.7 g/dL (ref 3.5–5.0)
Alkaline Phosphatase: 68 U/L (ref 38–126)
Anion gap: 7 (ref 5–15)
BUN: 6 mg/dL — ABNORMAL LOW (ref 8–23)
CO2: 26 mmol/L (ref 22–32)
Calcium: 8.8 mg/dL — ABNORMAL LOW (ref 8.9–10.3)
Chloride: 104 mmol/L (ref 98–111)
Creatinine, Ser: 0.97 mg/dL (ref 0.61–1.24)
GFR, Estimated: >60 mL/min (ref >60)
Glucose, Bld: 94 mg/dL (ref 70–99)
Potassium: 3.5 mmol/L (ref 3.5–5.1)
Sodium: 137 mmol/L (ref 135–145)
Total Bilirubin: 0.6 mg/dL (ref 0.0–1.2)
Total Protein: 7.4 g/dL (ref 6.5–8.1)

## 2023-12-31 LAB — CBC WITH DIFFERENTIAL/PLATELET
Abs Immature Granulocytes: 0.02 10*3/uL (ref 0.00–0.07)
Basophils Absolute: 0 10*3/uL (ref 0.0–0.1)
Basophils Relative: 0 %
Eosinophils Absolute: 0.1 10*3/uL (ref 0.0–0.5)
Eosinophils Relative: 2 %
HCT: 40.4 % (ref 39.0–52.0)
Hemoglobin: 12 g/dL — ABNORMAL LOW (ref 13.0–17.0)
Immature Granulocytes: 0 %
Lymphocytes Relative: 32 %
Lymphs Abs: 1.7 10*3/uL (ref 0.7–4.0)
MCH: 22.9 pg — ABNORMAL LOW (ref 26.0–34.0)
MCHC: 29.7 g/dL — ABNORMAL LOW (ref 30.0–36.0)
MCV: 77.2 fL — ABNORMAL LOW (ref 80.0–100.0)
Monocytes Absolute: 0.5 10*3/uL (ref 0.1–1.0)
Monocytes Relative: 10 %
Neutro Abs: 2.9 10*3/uL (ref 1.7–7.7)
Neutrophils Relative %: 56 %
Platelets: 230 10*3/uL (ref 150–400)
RBC: 5.23 MIL/uL (ref 4.22–5.81)
RDW: 15.5 % (ref 11.5–15.5)
WBC: 5.3 10*3/uL (ref 4.0–10.5)
nRBC: 0 % (ref 0.0–0.2)

## 2023-12-31 MED ORDER — PREDNISONE 10 MG PO TABS
40.0000 mg | ORAL_TABLET | Freq: Every day | ORAL | 0 refills | Status: AC
Start: 2023-12-31 — End: 2024-01-04

## 2023-12-31 MED ORDER — HYDROXYZINE HCL 25 MG PO TABS: 25.0000 mg | ORAL_TABLET | Freq: Three times a day (TID) | ORAL | 0 refills | Status: AC | PRN

## 2023-12-31 MED ORDER — PREDNISONE 10 MG PO TABS
60.0000 mg | ORAL_TABLET | Freq: Once | ORAL | Status: AC
  Administered 2023-12-31: 60 mg via ORAL
  Filled 2023-12-31: qty 1

## 2023-12-31 MED ORDER — HYDROXYZINE HCL 25 MG PO TABS
50.0000 mg | ORAL_TABLET | Freq: Once | ORAL | Status: AC
Start: 2023-12-31 — End: 2023-12-31
  Administered 2023-12-31: 50 mg via ORAL
  Filled 2023-12-31: qty 2

## 2023-12-31 NOTE — ED Triage Notes (Signed)
 Pt reports he is scratching all over body that started yesterday.  Denies rash.  Has not changed laundry or soap.

## 2023-12-31 NOTE — Discharge Instructions (Signed)
 Please follow-up closely with your primary care doctor on an outpatient basis for reevaluation.  Start taking the prednisone tomorrow as you have already been given today's dose.  You may take the hydroxyzine approximately 6 hours after discharge if needed for itching.  Return to emergency department immediately for any new or worsening symptoms.

## 2023-12-31 NOTE — ED Provider Notes (Signed)
 [place] EMERGENCY DEPARTMENT AT [facility] Provider Note   CSN: [id number] Arrival date & time: 12/31/23  1110     History  Chief Complaint  Patient presents with   Pruritis    Dennis Lane is a 63 y.o. male.  Patient is a 63 year old male who presents to the emergency department the chief complaint of diffuse pruritus which has been ongoing since yesterday.  He notes that he did note a rash to the posterior aspect of his neck yesterday as well.  He denies any other associated rashes throughout.  He denies any recent bug bites or tick bites.  He denies any associated fever, chills, chest pain, shortness of breath, abdominal pain, nausea, vomiting, diarrhea.  He has had no new soaps, lotions, deodorants, detergents, medications or exposure to anyone with a similar rash.        Home Medications Prior to Admission medications   Medication Sig Start Date End Date Taking? Authorizing Provider  acetaminophen (TYLENOL) 500 MG tablet Take by mouth.    [provider]  ADVAIR HFA [phone] MCG/ACT inhaler Inhale into the lungs 2 (two) times daily. 09/10/21   [provider]  fluticasone (FLONASE) 50 MCG/ACT nasal spray 1 spray into each nostril daily. 07/18/17   [provider]  omeprazole (PRILOSEC) 20 MG capsule Take 1 capsule (20 mg total) by mouth 2 (two) times daily before a meal for 14 days. 06/08/22 06/22/22  Toney Reil, MD  ondansetron (ZOFRAN) 4 MG tablet Take by mouth. 02/28/22   [provider]  polyethylene glycol (MIRALAX / GLYCOLAX) 17 g packet 1 packet mixed with 8 ounces of fluid 03/13/22   [provider]  PROAIR HFA 108 (90 Base) MCG/ACT inhaler Inhale 1-2 puffs into the lungs every 6 (six) hours as needed for wheezing or shortness of breath.  07/26/17   [provider]      Allergies    Patient has no known allergies.    Review of Systems   Review of Systems  Skin:        Pruritus    Physical  Exam Updated Vital Signs BP (!) 143/94 (BP Location: Right Arm)   Pulse (!) 53   Temp 97.7 F (36.5 C) (Oral)   Resp 15   Ht 5\' 7"  (1.702 m)   Wt 72.6 kg   SpO2 100%   BMI 25.06 kg/m  Physical Exam Vitals and nursing note reviewed.  Constitutional:      Appearance: Normal appearance.  HENT:     Head: Normocephalic and atraumatic.     Nose: Nose normal.     Mouth/Throat:     Mouth: Mucous membranes are moist.  Eyes:     Extraocular Movements: Extraocular movements intact.     Conjunctiva/sclera: Conjunctivae normal.     Pupils: Pupils are equal, round, and reactive to light.  Cardiovascular:     Rate and Rhythm: Normal rate and regular rhythm.     Pulses: Normal pulses.     Heart sounds: Normal heart sounds. No murmur heard.    No gallop.  Pulmonary:     Effort: Pulmonary effort is normal. No respiratory distress.     Breath sounds: Normal breath sounds. No stridor. No wheezing, rhonchi or rales.  Abdominal:     General: Abdomen is flat. Bowel sounds are normal. There is no distension.     Palpations: Abdomen is soft.     Tenderness: There is no abdominal tenderness.  Musculoskeletal:  General: Normal range of motion.     Cervical back: Normal range of motion and neck supple.  Skin:    General: Skin is warm and dry.     Findings: No bruising, lesion or rash.  Neurological:     General: No focal deficit present.     Mental Status: He is alert and oriented to person, place, and time. Mental status is at baseline.  Psychiatric:        Mood and Affect: Mood normal.        Behavior: Behavior normal.        Thought Content: Thought content normal.        Judgment: Judgment normal.     ED Results / Procedures / Treatments   Labs (all labs ordered are listed, but only abnormal results are displayed) Labs Reviewed  COMPREHENSIVE METABOLIC PANEL WITH GFR - Abnormal; Notable for the following components:      Result Value   BUN 6 (*)    Calcium 8.8 (*)    AST  14 (*)    All other components within normal limits  CBC WITH DIFFERENTIAL/PLATELET - Abnormal; Notable for the following components:   Hemoglobin 12.0 (*)    MCV 77.2 (*)    MCH 22.9 (*)    MCHC 29.7 (*)    All other components within normal limits    EKG None  Radiology No results found.  Procedures Procedures    Medications Ordered in ED Medications  hydrOXYzine (ATARAX) tablet 50 mg (50 mg Oral Given 12/31/23 1156)  predniSONE (DELTASONE) tablet 60 mg (60 mg Oral Given 12/31/23 1156)    ED Course/ Medical Decision Making/ A&P                                 Medical Decision Making Patient is doing well at this time and is stable for discharge home.  Discussed with patient that blood work has been unremarkable.  Suspect the patient is suffering from an acute allergic process at this point and we will treat accordingly on an outpatient basis.  Vital signs are stable with no indication for sepsis.  He has no signs of acute airway compromise at this point.  He has had no recent bug bites or tick bites.  He has no indication for underlying etiology such as Stevens-Johnson syndrome, toxic epidermal necrolysis, tickborne illness, meningitis, sepsis, syphilis.  Discussed with patient and need for close follow-up with his primary care doctor on an outpatient basis for reevaluation.  Strict return precautions were provided for any new or worsening symptoms.  Patient voiced understanding to the plan and had no additional questions.  Lab work was independently reviewed by myself demonstrating no changes in liver function, kidney function, electrolytes, glucose within normal limits  Amount and/or Complexity of Data Reviewed Labs: ordered.  Risk Prescription drug management.           Final Clinical Impression(s) / ED Diagnoses Final diagnoses:  None    Rx / DC Orders ED Discharge Orders     None         Lelon Perla, PA-C 12/31/23 1234    Gloris Manchester,  MD 12/31/23 [phone]

## 2024-08-23 ENCOUNTER — Other Ambulatory Visit: Payer: Self-pay | Admitting: Neurology

## 2024-08-23 DIAGNOSIS — G8929 Other chronic pain: Secondary | ICD-10-CM

## 2024-08-23 DIAGNOSIS — R2689 Other abnormalities of gait and mobility: Secondary | ICD-10-CM

## 2024-08-23 DIAGNOSIS — R200 Anesthesia of skin: Secondary | ICD-10-CM

## 2024-10-11 ENCOUNTER — Other Ambulatory Visit: Payer: Self-pay | Admitting: Neurology

## 2024-10-11 DIAGNOSIS — M542 Cervicalgia: Secondary | ICD-10-CM
